# Patient Record
Sex: Female | Born: 1940 | Race: White | Hispanic: No | Smoking: Former smoker
Health system: Southern US, Community
[De-identification: ages and names within clinical notes are randomized; demographics above are authoritative.]

## PROBLEM LIST (undated history)

## (undated) DIAGNOSIS — M1990 Unspecified osteoarthritis, unspecified site: Secondary | ICD-10-CM

## (undated) DIAGNOSIS — Z972 Presence of dental prosthetic device (complete) (partial): Secondary | ICD-10-CM

## (undated) DIAGNOSIS — E079 Disorder of thyroid, unspecified: Secondary | ICD-10-CM

## (undated) DIAGNOSIS — E039 Hypothyroidism, unspecified: Secondary | ICD-10-CM

## (undated) HISTORY — PX: THYROID SURGERY: SHX805

---

## 2003-05-20 ENCOUNTER — Other Ambulatory Visit: Payer: Self-pay

## 2006-07-16 ENCOUNTER — Ambulatory Visit: Payer: Self-pay | Admitting: Gastroenterology

## 2008-12-07 ENCOUNTER — Ambulatory Visit: Payer: Self-pay | Admitting: Podiatry

## 2009-03-23 ENCOUNTER — Ambulatory Visit (HOSPITAL_BASED_OUTPATIENT_CLINIC_OR_DEPARTMENT_OTHER): Admission: RE | Admit: 2009-03-23 | Discharge: 2009-03-23 | Payer: Self-pay | Admitting: Orthopedic Surgery

## 2010-09-14 LAB — POCT HEMOGLOBIN-HEMACUE: Hemoglobin: 12.3 g/dL (ref 12.0–15.0)

## 2012-03-13 ENCOUNTER — Ambulatory Visit: Payer: Self-pay | Admitting: Gastroenterology

## 2012-06-27 ENCOUNTER — Ambulatory Visit: Payer: Self-pay | Admitting: Emergency Medicine

## 2013-10-14 ENCOUNTER — Ambulatory Visit: Payer: Self-pay | Admitting: Physician Assistant

## 2015-02-24 ENCOUNTER — Other Ambulatory Visit: Payer: Self-pay | Admitting: Orthopedic Surgery

## 2015-02-24 DIAGNOSIS — M5442 Lumbago with sciatica, left side: Secondary | ICD-10-CM

## 2015-02-24 DIAGNOSIS — M5136 Other intervertebral disc degeneration, lumbar region: Secondary | ICD-10-CM

## 2015-03-04 ENCOUNTER — Ambulatory Visit
Admission: RE | Admit: 2015-03-04 | Discharge: 2015-03-04 | Disposition: A | Discharge status: Home / Self Care | Payer: Medicare Other | Source: Ambulatory Visit | Attending: Orthopedic Surgery | Admitting: Orthopedic Surgery

## 2015-03-04 DIAGNOSIS — M4806 Spinal stenosis, lumbar region: Secondary | ICD-10-CM | POA: Insufficient documentation

## 2015-03-04 DIAGNOSIS — M5442 Lumbago with sciatica, left side: Secondary | ICD-10-CM | POA: Diagnosis present

## 2015-03-04 DIAGNOSIS — M5136 Other intervertebral disc degeneration, lumbar region: Secondary | ICD-10-CM

## 2015-03-04 DIAGNOSIS — M4856XA Collapsed vertebra, not elsewhere classified, lumbar region, initial encounter for fracture: Secondary | ICD-10-CM | POA: Diagnosis not present

## 2015-06-02 ENCOUNTER — Ambulatory Visit
Admission: EM | Admit: 2015-06-02 | Discharge: 2015-06-02 | Disposition: A | Discharge status: Home / Self Care | Payer: Medicare Other | Attending: Family Medicine | Admitting: Family Medicine

## 2015-06-02 ENCOUNTER — Encounter: Payer: Self-pay | Admitting: Emergency Medicine

## 2015-06-02 DIAGNOSIS — M5441 Lumbago with sciatica, right side: Secondary | ICD-10-CM | POA: Diagnosis not present

## 2015-06-02 HISTORY — DX: Disorder of thyroid, unspecified: E07.9

## 2015-06-02 LAB — URINALYSIS COMPLETE WITH MICROSCOPIC (ARMC ONLY)
BACTERIA UA: NONE SEEN
Bilirubin Urine: NEGATIVE
GLUCOSE, UA: NEGATIVE mg/dL
HGB URINE DIPSTICK: NEGATIVE
LEUKOCYTES UA: NEGATIVE
NITRITE: NEGATIVE
PH: 6 (ref 5.0–8.0)
PROTEIN: NEGATIVE mg/dL
RBC / HPF: NONE SEEN RBC/hpf (ref 0–5)
SPECIFIC GRAVITY, URINE: 1.015 (ref 1.005–1.030)

## 2015-06-02 MED ORDER — MELOXICAM 15 MG PO TABS: 15.0000 mg | ORAL_TABLET | Freq: Every day | ORAL | Status: DC | PRN

## 2015-06-02 MED ORDER — KETOROLAC TROMETHAMINE 30 MG/ML IJ SOLN
30.0000 mg | Freq: Once | INTRAMUSCULAR | Status: AC
  Administered 2015-06-02: 30 mg via INTRAMUSCULAR

## 2015-06-02 NOTE — Discharge Instructions (Signed)
To medication as prescribed. Continue home medications. Apply ice. Rest. Avoid strenuous activity. Stretch well.  As discussed follow-up closely with her primary care physician and orthopedic. Return to urgent care proceed to the ER for increased pain, difficulty urinating, numbness or tingling sensation, difficulty or changes with urination or bowel including inability to urinate or move bowels or incontinence, new or worsening concerns.  Sciatica Sciatica is pain, weakness, numbness, or tingling along the path of the sciatic nerve. The nerve starts in the lower back and runs down the back of each leg. The nerve controls the muscles in the lower leg and in the back of the knee, while also providing sensation to the back of the thigh, lower leg, and the sole of your foot. Sciatica is a symptom of another medical condition. For instance, nerve damage or certain conditions, such as a herniated disk or bone spur on the spine, pinch or put pressure on the sciatic nerve. This causes the pain, weakness, or other sensations normally associated with sciatica. Generally, sciatica only affects one side of the body. CAUSES   Herniated or slipped disc.  Degenerative disk disease.  A pain disorder involving the narrow muscle in the buttocks (piriformis syndrome).  Pelvic injury or fracture.  Pregnancy.  Tumor (rare). SYMPTOMS  Symptoms can vary from mild to very severe. The symptoms usually travel from the low back to the buttocks and down the back of the leg. Symptoms can include:  Mild tingling or dull aches in the lower back, leg, or hip.  Numbness in the back of the calf or sole of the foot.  Burning sensations in the lower back, leg, or hip.  Sharp pains in the lower back, leg, or hip.  Leg weakness.  Severe back pain inhibiting movement. These symptoms may get worse with coughing, sneezing, laughing, or prolonged sitting or standing. Also, being overweight may worsen symptoms. DIAGNOSIS    Your caregiver will perform a physical exam to look for common symptoms of sciatica. He or she may ask you to do certain movements or activities that would trigger sciatic nerve pain. Other tests may be performed to find the cause of the sciatica. These may include:  Blood tests.  X-rays.  Imaging tests, such as an MRI or CT scan. TREATMENT  Treatment is directed at the cause of the sciatic pain. Sometimes, treatment is not necessary and the pain and discomfort goes away on its own. If treatment is needed, your caregiver may suggest:  Over-the-counter medicines to relieve pain.  Prescription medicines, such as anti-inflammatory medicine, muscle relaxants, or narcotics.  Applying heat or ice to the painful area.  Steroid injections to lessen pain, irritation, and inflammation around the nerve.  Reducing activity during periods of pain.  Exercising and stretching to strengthen your abdomen and improve flexibility of your spine. Your caregiver may suggest losing weight if the extra weight makes the back pain worse.  Physical therapy.  Surgery to eliminate what is pressing or pinching the nerve, such as a bone spur or part of a herniated disk. HOME CARE INSTRUCTIONS   Only take over-the-counter or prescription medicines for pain or discomfort as directed by your caregiver.  Apply ice to the affected area for 20 minutes, 3-4 times a day for the first 48-72 hours. Then try heat in the same way.  Exercise, stretch, or perform your usual activities if these do not aggravate your pain.  Attend physical therapy sessions as directed by your caregiver.  Keep all follow-up appointments  as directed by your caregiver.  Do not wear high heels or shoes that do not provide proper support.  Check your mattress to see if it is too soft. A firm mattress may lessen your pain and discomfort. SEEK IMMEDIATE MEDICAL CARE IF:   You lose control of your bowel or bladder (incontinence).  You have  increasing weakness in the lower back, pelvis, buttocks, or legs.  You have redness or swelling of your back.  You have a burning sensation when you urinate.  You have pain that gets worse when you lie down or awakens you at night.  Your pain is worse than you have experienced in the past.  Your pain is lasting longer than 4 weeks.  You are suddenly losing weight without reason. MAKE SURE YOU:  Understand these instructions.  Will watch your condition.  Will get help right away if you are not doing well or get worse.   This information is not intended to replace advice given to you by your health care provider. Make sure you discuss any questions you have with your health care provider.   Document Released: 05/22/2001 Document Revised: 02/16/2015 Document Reviewed: 10/07/2011 Elsevier Interactive Patient Education [employer]

## 2015-06-02 NOTE — ED Notes (Signed)
Pt reports right lower back pain that radiates down right leg and has been off and on since spring time when fell after attacked by dog. Pt reports seen initially but then pain resolved and returned about 1 month ago and worsened this morning

## 2015-06-02 NOTE — ED Provider Notes (Signed)
[facility]  ____________________________________________  Time seen: Approximately 9:39 AM  I have reviewed the triage vital signs and the nursing notes.   HISTORY  Chief Complaint Back Pain   HPI Maria Hood is a 74 y.o. female presents for complaints of right-sided low back pain. Patient states that she has had this pain for at least 6 months. Patient states that she has been followed with orthopedic as well as her primary care physician for the same. States that this pain is same as her normal pain. Patient states that the only difference is that over the last 2-3 weeks her pain has been more frequent with radiation down right leg. Reports that the pain radiation down right leg is consistent with her chronic back pain flareups. Patient states that she has had the exact same in the past multiple times. Denies midline pain.   Patient reports that she was following with orthopedic Dedra Skeensodd Mundy for same. Patient reports MRI in September of this year due to the same pain.  Reports current pain is 5/10 aching with intermittent radiation down right lower leg, States radiation is intermittent, and does not necessarily depend on movements. Denies numbness or tingling sensations.   Patient denies fall, trauma or injury. Reports continues to eat and drink well. Denies urinary or bowel continence or attention. Denies difficulty walking or ambulating. Denies other pain radiation. Denies abdominal pain, chest pain, dysuria, shortness of breath, weakness, numbness, tingling sensations, dizziness, neck or upper back pain.  Patient reports that she has been taking her home tramadol as prescribed but states it only helps somewhat.  PCP:  Dr. Elmer RampVirk. Orthopedic: Lenard ForthMundy   Past Medical History  Diagnosis Date  . Thyroid disease     There are no active problems to display for this patient.   Past Surgical History  Procedure Laterality Date  . Thyroid surgery      Current Outpatient  Rx  Name  Route  Sig  Dispense  Refill  . Aspirin 81 MG tablet   Oral   Take 81 mg by mouth daily.         . calcium carbonate (OS-CAL) 1250 (500 CA) MG chewable tablet   Oral   Chew 1 tablet by mouth daily.         Marland Kitchen. leflunomide (ARAVA) 20 MG tablet   Oral   Take 20 mg by mouth daily.         Marland Kitchen. levothyroxine (SYNTHROID, LEVOTHROID) 100 MCG tablet   Oral   Take 100 mcg by mouth daily before breakfast.         . Multiple Vitamin (MULTIVITAMIN) tablet   Oral   Take 1 tablet by mouth daily.         . simvastatin (ZOCOR) 20 MG tablet   Oral   Take 20 mg by mouth daily.         . traMADol (ULTRAM) 50 MG tablet   Oral   Take by mouth every 6 (six) hours as needed.           Allergies Sulfa antibiotics  No family history on file.  Social History Social History  Substance Use Topics  . Smoking status: Former Games developermoker  . Smokeless tobacco: None  . Alcohol Use: No    Review of Systems Constitutional: No fever/chills Eyes: No visual changes. ENT: No sore throat. Cardiovascular: Denies chest pain. Respiratory: Denies shortness of breath. Gastrointestinal: No abdominal pain.  No nausea, no vomiting.  No diarrhea.  No  constipation. Genitourinary: Negative for dysuria. Musculoskeletal: positive for back pain. Skin: Negative for rash. Neurological: Negative for headaches, focal weakness or numbness.  10-point ROS otherwise negative.  ____________________________________________   PHYSICAL EXAM:  VITAL SIGNS: ED Triage Vitals  Enc Vitals Group     BP 06/02/15 0849 158/74 mmHg     Pulse Rate 06/02/15 0849 81     Resp 06/02/15 0849 16     Temp 06/02/15 0849 97.9 F (36.6 C)     Temp Source 06/02/15 0849 Tympanic     SpO2 06/02/15 0849 100 %     Weight 06/02/15 0849 115 lb (52.164 kg)     Height 06/02/15 0849  (1.626 m)     Head Cir --      Peak Flow --      Pain Score 06/02/15 0855 9     Pain Loc --      Pain Edu? --      Excl. in GC? --      Constitutional: Alert and oriented. Well appearing and in no acute distress. Eyes: Conjunctivae are normal. PERRL. EOMI. Head: Atraumatic.  Nose: No congestion/rhinnorhea.  Mouth/Throat: Mucous membranes are moist.  Oropharynx non-erythematous. Neck: No stridor.  No cervical spine tenderness to palpation. Hematological/Lymphatic/Immunilogical: No cervical lymphadenopathy. Cardiovascular: Normal rate, regular rhythm. Grossly normal heart sounds.  Good peripheral circulation. Respiratory: Normal respiratory effort.  No retractions. Lungs CTAB. Gastrointestinal: Soft and nontender.  Normal Bowel sounds. No CVA tenderness. Musculoskeletal: No lower or upper extremity tenderness nor edema.  No joint effusions. Bilateral pedal pulses equal and easily palpated. No midline cervical, thoracic or lumbar tenderness to palpation. Mild tenderness to palpation right lower lumbar area at greater sciatic notch, bilateral lower extremities nontender to palpation with full range of motion, bilateral straight leg test negative, no saddle anesthesia, no point bony tenderness, steady gait, bilateral plantar flexion and dorsiflexion with good strength. 5 out of 5 strength to bilateral upper and lower extremities. Neurologic:  Normal speech and language. No gross focal neurologic deficits are appreciated. No gait instability. Skin:  Skin is warm, dry and intact. No rash noted. Psychiatric: Mood and affect are normal. Speech and behavior are normal.  ____________________________________________   LABS (all labs ordered are listed, but only abnormal results are displayed)  Labs Reviewed  URINALYSIS COMPLETEWITH MICROSCOPIC ([facility] ONLY) - Abnormal; Notable for the following:    Ketones, ur TRACE (*)    Squamous Epithelial / LPF 6-30 (*)    All other components within normal limits    INITIAL IMPRESSION / ASSESSMENT AND PLAN / ED COURSE  Pertinent labs & imaging results that were available during my care of  the patient were reviewed by me and considered in my medical decision making (see chart for details).  Very well-appearing patient. No acute distress. Presents with complaints of right-sided low back pain. Patient reports that this is a chronic issue for her with acute flare up. Patient reports that this is consistent with acute flareups of the chronic pain. Patient does also report that she does have intermittent relation down right leg which is also consistent with past for the same pain. Denies fall, trauma or direct injury. Patient reports that she is currently not taking any anti-inflammatories and denies contraindication to same. Denies renal insufficiency or cardiac history. Patient reports that she has been taking her home tramadol without much improvement.  Epic utilized and reviewed labs from 03/15/2015 revealing intact kidney functions. Also epic utilizing and reviewed MRI lumbar from  September 2016, which per radiologist showed acute L2 compression fracture, moderate L3 compression fracture as well as spinal stenosis with L3 superior endplate retropulsion.   Again very well-appearing patient. Ambulatory in room. Changes positions from lying to standing quickly without discomfort or distress. No focal neurological deficit. Suspect acute on chronic low back pain and right sciatica. Will treat with Toradol 30 mg IM 1 in urgent care as well as start patient on daily Mobic. Encouraged patient to follow-up closely with her primary care physician as well as orthopedic for this chronic pain.  Discussed follow up with Primary care physician this week. Discussed follow up and return parameters to urgent care as well as ER for numbness, tingling sensation, urinary or bowel retention or incontinence, increased pain, difficulty urinating, is ion or any worsening concerns. Patient verbalized understanding and agreed to plan.   ____________________________________________   FINAL CLINICAL IMPRESSION(S) /  ED DIAGNOSES  Final diagnoses:  Right-sided low back pain with right-sided sciatica     Renford Dills, NP 06/02/15 1117

## 2016-05-07 IMAGING — MR MR LUMBAR SPINE W/O CM
4 of 6 series · 28 of 48 positions shown · non-contrast
Comparison: None.

CLINICAL DATA: Low back pain with right lower extremity
radiculopathy following a fall 3 weeks ago. Initial encounter.

EXAM:
MRI LUMBAR SPINE WITHOUT CONTRAST
TECHNIQUE: Multiplanar, multisequence MR imaging of the lumbar spine was
performed. No intravenous contrast was administered.

[Series 3: T2 · sagittal · 4.0mm · 0.81mm/px · 6 of 17 slices shown (1 of 3)]
[im 1/17]
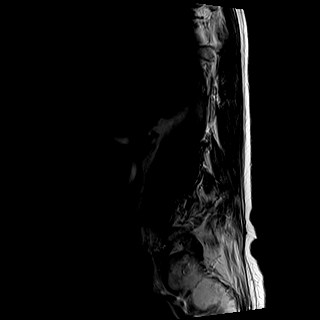
[im 4/17]
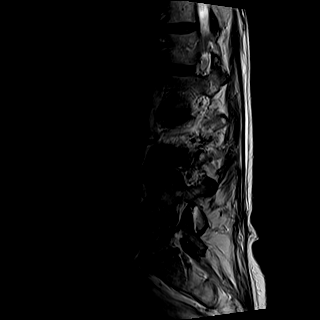
[im 7/17]
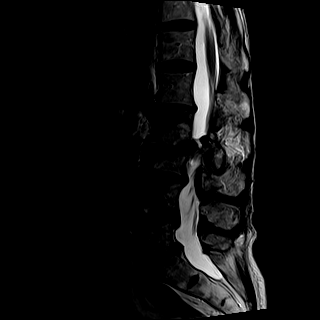
[im 10/17]
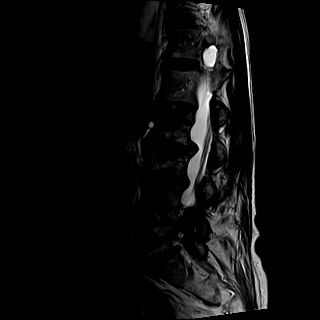
[im 13/17]
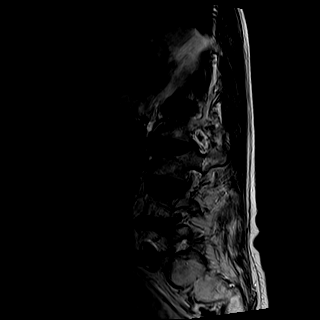
[im 17/17]
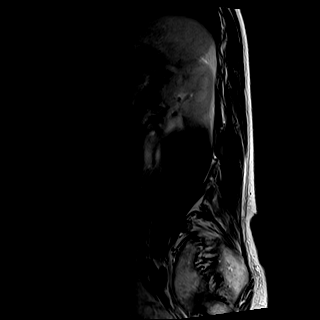

[Series 4: T1 · sagittal · 4.0mm · 0.41mm/px · 6 of 17 slices shown]
[im 1/17]
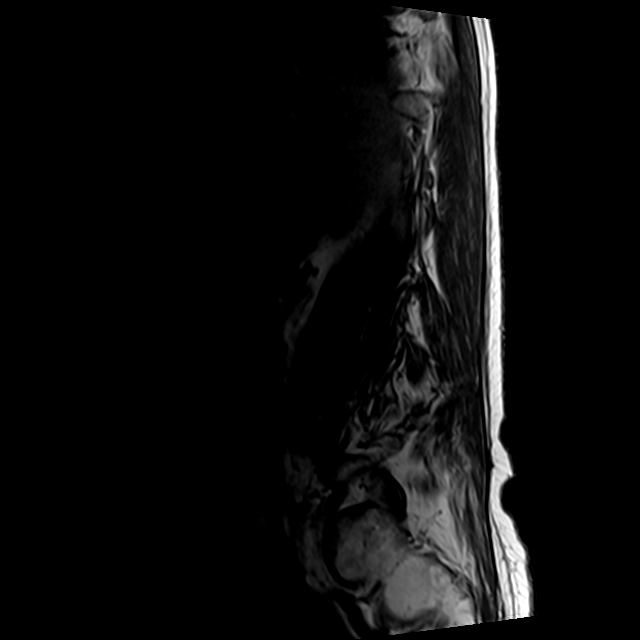
[im 4/17]
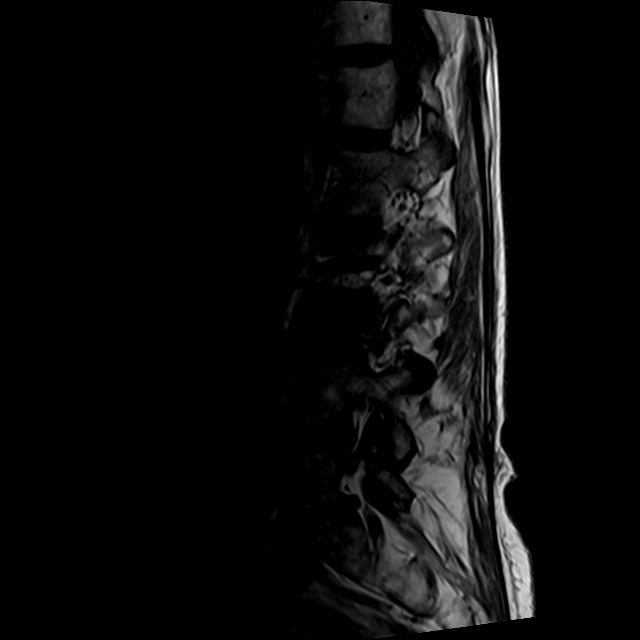
[im 7/17]
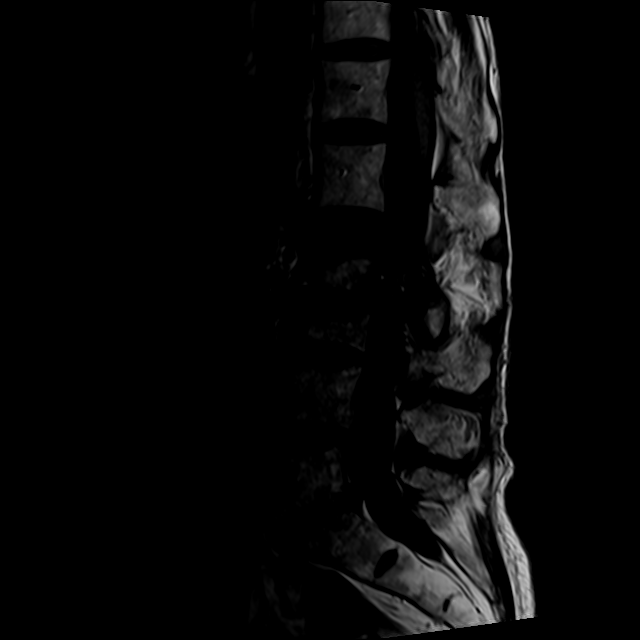
[im 10/17]
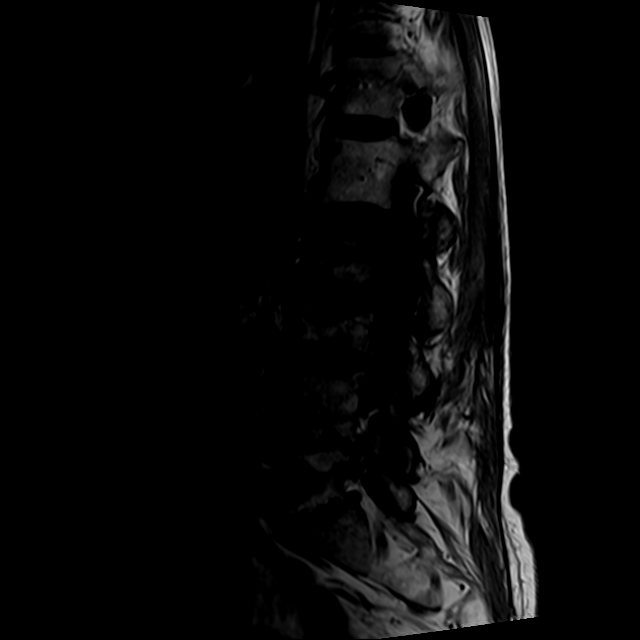
[im 13/17]
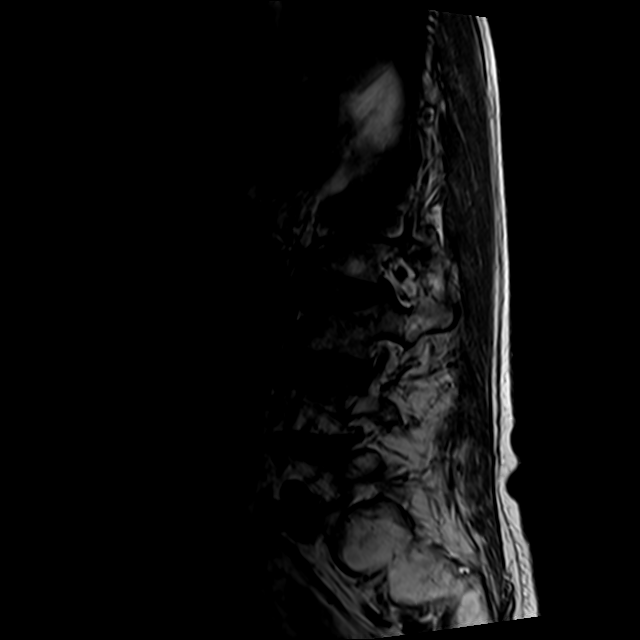
[im 17/17]
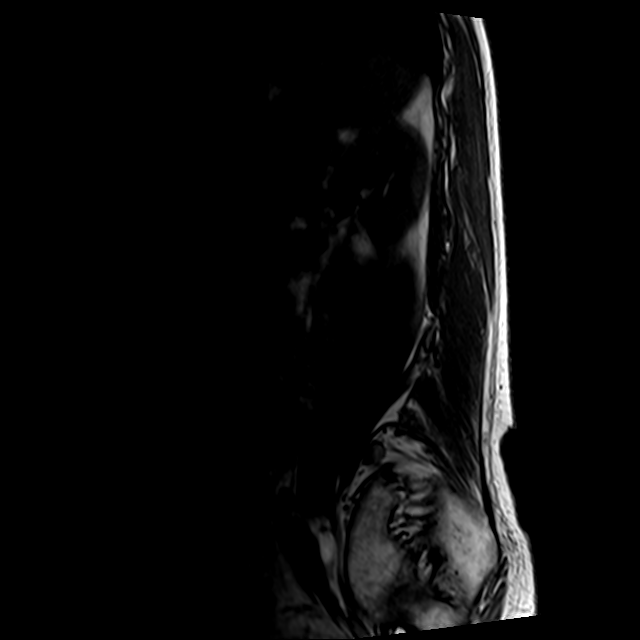

[Series 6: T2 · axial · 4.0mm · 0.78mm/px · z∈[-81,+108]mm · 9 of 39 slices shown (2 of 3)]
[im 1/39]
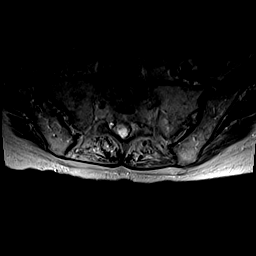
[im 7/39]
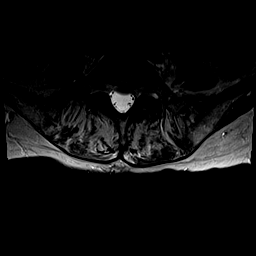
[im 11/39]
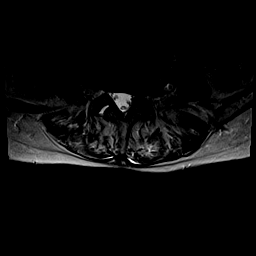
[im 18/39]
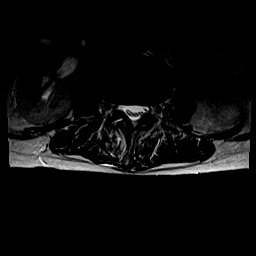
[im 21/39]
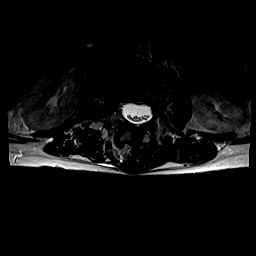
[im 28/39]
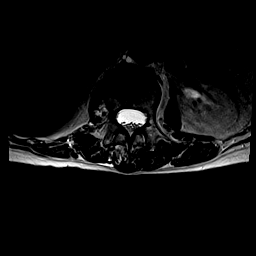
[im 32/39]
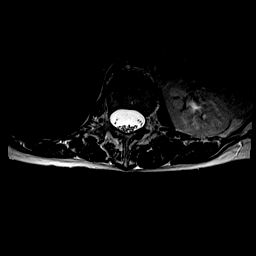
[im 35/39]
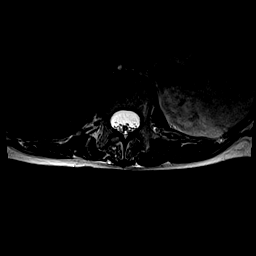
[im 39/39]
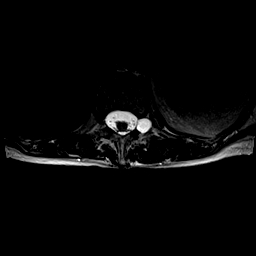

[Series 8: T2 · coronal · 3.0mm · 1.00mm/px · 7 of 23 slices shown (3 of 3)]
[im 1/23]
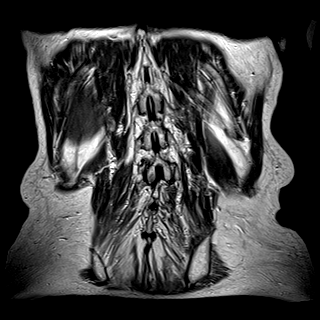
[im 4/23]
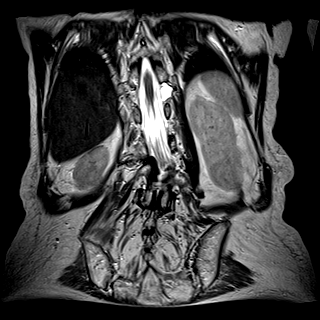
[im 8/23]
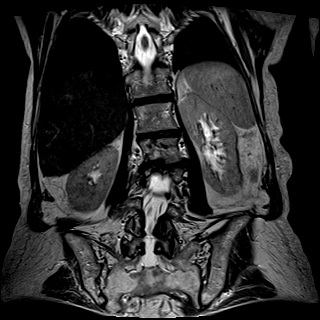
[im 12/23]
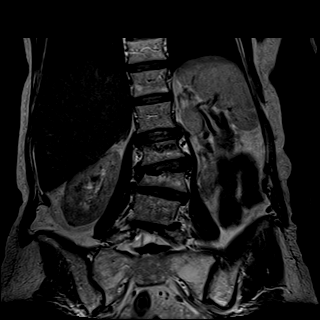
[im 15/23]
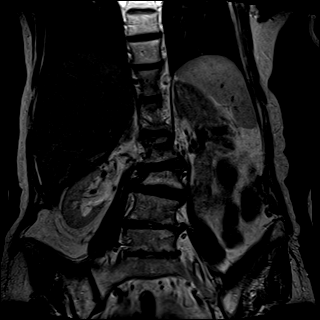
[im 19/23]
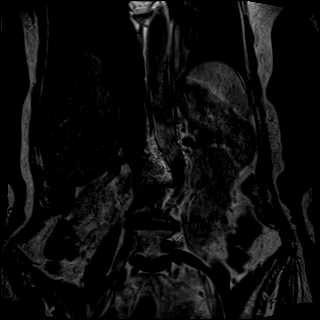
[im 23/23]
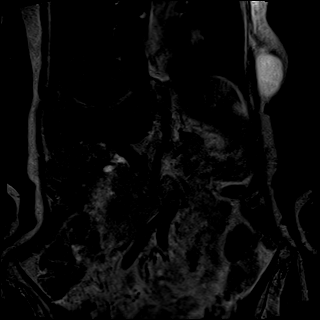

[28 of 48 positions shown; findings below may reference images not displayed]

FINDINGS: For the purposes of this dictation, the lowest well-formed
intervertebral disc space is presumed to be L5-S1.

There is mild lumbar levoscoliosis with apex at L2-3. There are L2
and L3 superior endplate compression fractures demonstrating 20% and
60% height loss, respectively. A fracture line is clearly visible
through the L2 vertebral body with prominent associated marrow edema
extending into the left pedicle and no significant retropulsion.
There is a thin residual cleft subjacent to the L3 superior endplate
with minimal residual edema. L3 superior endplate retropulsion
measure 7 mm.

Grade 1 retrolisthesis of L4 on L5 measures 6 mm, likely facet
mediated. There is diffuse lumbar disc desiccation. Disc space
narrowing is mild at L2-3 and L3-4 and moderate at L4-5 and L5-S1
with associated mild type 2 degenerative endplate changes. Conus
medullaris is normal in signal and terminates at the superior aspect
of L1. A 2 cm cyst is noted in the left T12-L1 neural foramen.

L1-2:  Minimal disc bulging and endplate spurring without stenosis.

L2-3: Broad-based posterior disc osteophyte complex, L3
retropulsion, and mild facet and ligamentum flavum hypertrophy
result in mild spinal stenosis, mild right lateral recess stenosis,
and mild-to-moderate right neural foraminal stenosis. The right L3
nerve root may be affected in the lateral recess.

L3-4: Mild disc bulging asymmetric to the right and mild facet and
ligamentum flavum hypertrophy result in mild spinal stenosis, mild
left lateral recess stenosis, and no significant neural foraminal
stenosis.

L4-5: Listhesis with disc uncovering, left subarticular pseudo disc
protrusion, and mild facet hypertrophy result in severe left and
mild right lateral recess stenosis, moderate left neural foraminal
stenosis, and no significant spinal stenosis. The left L5 nerve root
may be affected in the lateral recess.

L5-S1: Mild circumferential disc bulging, broad central disc
protrusion, and mild left greater than right facet hypertrophy
result in mild bilateral neural foraminal stenosis without spinal
stenosis.
IMPRESSION: 1. Acute, mild L2 compression fracture.
2. Moderate L3 compression fracture which appears older than the L2
fracture though likely incompletely healed.
3. L3 superior endplate retropulsion contributes to mild spinal
stenosis, mild right lateral recess stenosis, and mild-to-moderate
right neural foraminal stenosis. The right L3 nerve root could be
affected in the lateral recess.
4. Mild spinal stenosis and severe left lateral recess stenosis at
L4-5.

## 2018-11-24 ENCOUNTER — Ambulatory Visit: Admit: 2018-11-24 | Payer: Medicare Other | Admitting: Ophthalmology

## 2018-11-24 SURGERY — PHACOEMULSIFICATION, CATARACT, WITH IOL INSERTION
Anesthesia: Topical | Laterality: Left

## 2019-01-26 ENCOUNTER — Encounter: Payer: Self-pay | Admitting: *Deleted

## 2019-01-26 ENCOUNTER — Other Ambulatory Visit: Payer: Self-pay

## 2019-01-28 NOTE — Discharge Instructions (Signed)

## 2019-01-29 ENCOUNTER — Other Ambulatory Visit: Payer: Self-pay

## 2019-01-29 ENCOUNTER — Other Ambulatory Visit
Admission: RE | Admit: 2019-01-29 | Discharge: 2019-01-29 | Disposition: A | Discharge status: Home / Self Care | Payer: Medicare Other | Source: Ambulatory Visit | Attending: Ophthalmology | Admitting: Ophthalmology

## 2019-01-29 DIAGNOSIS — Z20828 Contact with and (suspected) exposure to other viral communicable diseases: Secondary | ICD-10-CM | POA: Diagnosis not present

## 2019-01-29 DIAGNOSIS — Z01812 Encounter for preprocedural laboratory examination: Secondary | ICD-10-CM | POA: Insufficient documentation

## 2019-01-29 LAB — SARS CORONAVIRUS 2 (TAT 6-24 HRS): SARS Coronavirus 2: NEGATIVE

## 2019-02-02 ENCOUNTER — Ambulatory Visit: Payer: Medicare Other | Admitting: Anesthesiology

## 2019-02-02 ENCOUNTER — Other Ambulatory Visit: Payer: Self-pay

## 2019-02-02 ENCOUNTER — Ambulatory Visit
Admission: RE | Admit: 2019-02-02 | Discharge: 2019-02-02 | Disposition: A | Discharge status: Home / Self Care | Payer: Medicare Other | Attending: Ophthalmology | Admitting: Ophthalmology

## 2019-02-02 ENCOUNTER — Encounter
Admission: RE | Disposition: A | Discharge status: Home / Self Care | Payer: Self-pay | Source: Home / Self Care | Attending: Ophthalmology

## 2019-02-02 DIAGNOSIS — M1990 Unspecified osteoarthritis, unspecified site: Secondary | ICD-10-CM | POA: Diagnosis not present

## 2019-02-02 DIAGNOSIS — Z79890 Hormone replacement therapy (postmenopausal): Secondary | ICD-10-CM | POA: Diagnosis not present

## 2019-02-02 DIAGNOSIS — Z888 Allergy status to other drugs, medicaments and biological substances status: Secondary | ICD-10-CM | POA: Diagnosis not present

## 2019-02-02 DIAGNOSIS — I10 Essential (primary) hypertension: Secondary | ICD-10-CM | POA: Insufficient documentation

## 2019-02-02 DIAGNOSIS — E039 Hypothyroidism, unspecified: Secondary | ICD-10-CM | POA: Insufficient documentation

## 2019-02-02 DIAGNOSIS — Z79891 Long term (current) use of opiate analgesic: Secondary | ICD-10-CM | POA: Diagnosis not present

## 2019-02-02 DIAGNOSIS — Z87891 Personal history of nicotine dependence: Secondary | ICD-10-CM | POA: Diagnosis not present

## 2019-02-02 DIAGNOSIS — Z79899 Other long term (current) drug therapy: Secondary | ICD-10-CM | POA: Insufficient documentation

## 2019-02-02 DIAGNOSIS — H2512 Age-related nuclear cataract, left eye: Secondary | ICD-10-CM | POA: Diagnosis not present

## 2019-02-02 DIAGNOSIS — M810 Age-related osteoporosis without current pathological fracture: Secondary | ICD-10-CM | POA: Diagnosis not present

## 2019-02-02 HISTORY — DX: Presence of dental prosthetic device (complete) (partial): Z97.2

## 2019-02-02 HISTORY — PX: CATARACT EXTRACTION W/PHACO: SHX586

## 2019-02-02 HISTORY — DX: Unspecified osteoarthritis, unspecified site: M19.90

## 2019-02-02 SURGERY — PHACOEMULSIFICATION, CATARACT, WITH IOL INSERTION
Anesthesia: Monitor Anesthesia Care | Site: Eye | Laterality: Left

## 2019-02-02 MED ORDER — ACETAMINOPHEN 325 MG PO TABS: 325.0000 mg | ORAL_TABLET | Freq: Once | ORAL | Status: DC

## 2019-02-02 MED ORDER — MOXIFLOXACIN HCL 0.5 % OP SOLN
OPHTHALMIC | Status: DC | PRN
  Administered 2019-02-02: 0.2 mL via OPHTHALMIC

## 2019-02-02 MED ORDER — MIDAZOLAM HCL 2 MG/2ML IJ SOLN
INTRAMUSCULAR | Status: DC | PRN
  Administered 2019-02-02: 1 mg via INTRAVENOUS

## 2019-02-02 MED ORDER — SODIUM HYALURONATE 10 MG/ML IO SOLN
INTRAOCULAR | Status: DC | PRN
  Administered 2019-02-02: 0.55 mL via INTRAOCULAR

## 2019-02-02 MED ORDER — EPINEPHRINE PF 1 MG/ML IJ SOLN
INTRAOCULAR | Status: DC | PRN
  Administered 2019-02-02: 88 mL via OPHTHALMIC

## 2019-02-02 MED ORDER — ACETAMINOPHEN 160 MG/5ML PO SOLN: 325.0000 mg | Freq: Once | ORAL | Status: DC

## 2019-02-02 MED ORDER — SODIUM HYALURONATE 23 MG/ML IO SOLN
INTRAOCULAR | Status: DC | PRN
  Administered 2019-02-02: 0.6 mL via INTRAOCULAR

## 2019-02-02 MED ORDER — TETRACAINE HCL 0.5 % OP SOLN
1.0000 [drp] | OPHTHALMIC | Status: DC | PRN
  Administered 2019-02-02 (×3): 1 [drp] via OPHTHALMIC

## 2019-02-02 MED ORDER — ARMC OPHTHALMIC DILATING DROPS
1.0000 "application " | OPHTHALMIC | Status: DC | PRN
  Administered 2019-02-02 (×3): 1 via OPHTHALMIC

## 2019-02-02 MED ORDER — FENTANYL CITRATE (PF) 100 MCG/2ML IJ SOLN
INTRAMUSCULAR | Status: DC | PRN
  Administered 2019-02-02: 50 ug via INTRAVENOUS

## 2019-02-02 MED ORDER — LIDOCAINE HCL (PF) 2 % IJ SOLN
INTRAOCULAR | Status: DC | PRN
  Administered 2019-02-02: 1 mL via INTRAOCULAR

## 2019-02-02 SURGICAL SUPPLY — 19 items
CANNULA ANT/CHMB 27G (MISCELLANEOUS) ×2 IMPLANT
CANNULA ANT/CHMB 27GA (MISCELLANEOUS) ×6 IMPLANT
DISSECTOR HYDRO NUCLEUS 50X22 (MISCELLANEOUS) ×3 IMPLANT
GLOVE SURG LX 7.5 STRW (GLOVE) ×2
GLOVE SURG LX STRL 7.5 STRW (GLOVE) ×1 IMPLANT
GLOVE SURG SYN 8.5  E (GLOVE) ×2
GLOVE SURG SYN 8.5 E (GLOVE) ×1 IMPLANT
GLOVE SURG SYN 8.5 PF PI (GLOVE) ×1 IMPLANT
GOWN STRL REUS W/ TWL LRG LVL3 (GOWN DISPOSABLE) ×2 IMPLANT
GOWN STRL REUS W/TWL LRG LVL3 (GOWN DISPOSABLE) ×6
LENS IOL TECNIS ITEC 23.0 (Intraocular Lens) ×2 IMPLANT
MARKER SKIN DUAL TIP RULER LAB (MISCELLANEOUS) ×3 IMPLANT
PACK DR. KING ARMS (PACKS) ×3 IMPLANT
PACK EYE AFTER SURG (MISCELLANEOUS) ×3 IMPLANT
PACK OPTHALMIC (MISCELLANEOUS) ×3 IMPLANT
SYR 3ML LL SCALE MARK (SYRINGE) ×3 IMPLANT
SYR TB 1ML LUER SLIP (SYRINGE) ×3 IMPLANT
WATER STERILE IRR 250ML POUR (IV SOLUTION) ×3 IMPLANT
WIPE NON LINTING 3.25X3.25 (MISCELLANEOUS) ×3 IMPLANT

## 2019-02-02 NOTE — Anesthesia Procedure Notes (Signed)
Procedure Name: MAC Date/Time: 02/02/2019 10:31 AM Performed by: Cameron Ali, CRNA Pre-anesthesia Checklist: Patient identified, Emergency Drugs available, Suction available, Timeout performed and Patient being monitored Patient Re-evaluated:Patient Re-evaluated prior to induction Oxygen Delivery Method: Nasal cannula Placement Confirmation: positive ETCO2

## 2019-02-02 NOTE — H&P (Signed)

## 2019-02-02 NOTE — Transfer of Care (Signed)
Immediate Anesthesia Transfer of Care Note  Patient: Maria Hood  Procedure(s) Performed: CATARACT EXTRACTION PHACO AND INTRAOCULAR LENS PLACEMENT (IOC) LEFT (Left Eye)  Patient Location: PACU  Anesthesia Type: MAC  Level of Consciousness: awake, alert  and patient cooperative  Airway and Oxygen Therapy: Patient Spontanous Breathing and Patient connected to supplemental oxygen  Post-op Assessment: Post-op Vital signs reviewed, Patient's Cardiovascular Status Stable, Respiratory Function Stable, Patent Airway and No signs of Nausea or vomiting  Post-op Vital Signs: Reviewed and stable  Complications: No apparent anesthesia complications

## 2019-02-02 NOTE — Op Note (Signed)
OPERATIVE NOTE  Maria Hood [id number] 02/02/2019   PREOPERATIVE DIAGNOSIS:  Nuclear sclerotic cataract left eye.  H25.12   POSTOPERATIVE DIAGNOSIS:    Nuclear sclerotic cataract left eye.     PROCEDURE:  Phacoemusification with posterior chamber intraocular lens placement of the left eye   LENS:   Implant Name Type Inv. Item Serial No. Manufacturer Lot No. LRB No. Used Action  LENS IOL DIOP 23.0 - [id number] Intraocular Lens LENS IOL DIOP 23.0 [id number] AMO  Left 1 Implanted       PCB00 +23.0   ULTRASOUND TIME: 1 minutes 13 seconds.  CDE 16.79   SURGEON:  Benay Pillow, MD, MPH   ANESTHESIA:  Topical with tetracaine drops augmented with 1% preservative-free intracameral lidocaine.  ESTIMATED BLOOD LOSS: <1 mL   COMPLICATIONS:  None.   DESCRIPTION OF PROCEDURE:  The patient was identified in the holding room and transported to the operating room and placed in the supine position under the operating microscope.  The left eye was identified as the operative eye and it was prepped and draped in the usual sterile ophthalmic fashion.   A 1.0 millimeter clear-corneal paracentesis was made at the 5:00 position. 0.5 ml of preservative-free 1% lidocaine with epinephrine was injected into the anterior chamber.  The anterior chamber was filled with Healon 5 viscoelastic.  A 2.4 millimeter keratome was used to make a near-clear corneal incision at the 2:00 position.  A curvilinear capsulorrhexis was made with a cystotome and capsulorrhexis forceps.  Balanced salt solution was used to hydrodissect and hydrodelineate the nucleus.   Phacoemulsification was then used in stop and chop fashion to remove the lens nucleus and epinucleus.  The remaining cortex was then removed using the irrigation and aspiration handpiece. Healon was then placed into the capsular bag to distend it for lens placement.  A lens was then injected into the capsular bag.  The remaining viscoelastic was aspirated.    Wounds were hydrated with balanced salt solution.  The anterior chamber was inflated to a physiologic pressure with balanced salt solution.  Intracameral vigamox 0.1 mL undiltued was injected into the eye and a drop placed onto the ocular surface.  No wound leaks were noted.  The patient was taken to the recovery room in stable condition without complications of anesthesia or surgery  Benay Pillow 02/02/2019, 10:56 AM

## 2019-02-02 NOTE — Anesthesia Preprocedure Evaluation (Signed)
Anesthesia Evaluation  Patient identified by MRN, date of birth, ID band Patient awake    Reviewed: Allergy & Precautions, H&P , NPO status , Patient's Chart, lab work & pertinent test results  Airway Mallampati: II  TM Distance: >3 FB Neck ROM: full    Dental no notable dental hx.    Pulmonary former smoker,    Pulmonary exam normal breath sounds clear to auscultation       Cardiovascular Normal cardiovascular exam Rhythm:regular Rate:Normal     Neuro/Psych    GI/Hepatic   Endo/Other  Hypothyroidism   Renal/GU      Musculoskeletal   Abdominal   Peds  Hematology   Anesthesia Other Findings   Reproductive/Obstetrics                             Anesthesia Physical Anesthesia Plan  ASA: II  Anesthesia Plan: MAC   Post-op Pain Management:    Induction:   PONV Risk Score and Plan: 2 and Midazolam, TIVA and Treatment may vary due to age or medical condition  Airway Management Planned:   Additional Equipment:   Intra-op Plan:   Post-operative Plan:   Informed Consent: I have reviewed the patients History and Physical, chart, labs and discussed the procedure including the risks, benefits and alternatives for the proposed anesthesia with the patient or authorized representative who has indicated his/her understanding and acceptance.       Plan Discussed with: CRNA  Anesthesia Plan Comments:         Anesthesia Quick Evaluation

## 2019-02-02 NOTE — Anesthesia Postprocedure Evaluation (Signed)
Anesthesia Post Note  Patient: Maria Hood  Procedure(s) Performed: CATARACT EXTRACTION PHACO AND INTRAOCULAR LENS PLACEMENT (IOC) LEFT (Left Eye)  Patient location during evaluation: PACU Anesthesia Type: MAC Level of consciousness: awake and alert and oriented Pain management: satisfactory to patient Vital Signs Assessment: post-procedure vital signs reviewed and stable Respiratory status: spontaneous breathing, nonlabored ventilation and respiratory function stable Cardiovascular status: blood pressure returned to baseline and stable Postop Assessment: Adequate PO intake and No signs of nausea or vomiting Anesthetic complications: no    Raliegh Ip

## 2019-02-03 ENCOUNTER — Encounter: Payer: Self-pay | Admitting: Ophthalmology

## 2019-02-20 NOTE — Anesthesia Preprocedure Evaluation (Addendum)
Anesthesia Evaluation  Patient identified by MRN, date of birth, ID band Patient awake    Reviewed: Allergy & Precautions, NPO status , Patient's Chart, lab work & pertinent test results  History of Anesthesia Complications Negative for: history of anesthetic complications  Airway Mallampati: II  TM Distance: >3 FB Neck ROM: Limited    Dental  (+) Lower Dentures, Upper Dentures   Pulmonary former smoker,    breath sounds clear to auscultation       Cardiovascular (-) angina(-) DOE  Rhythm:Regular Rate:Normal     Neuro/Psych    GI/Hepatic neg GERD  ,  Endo/Other  Hypothyroidism   Renal/GU      Musculoskeletal  (+) Arthritis  (On Plaquenil), Rheumatoid disorders,    Abdominal   Peds  Hematology   Anesthesia Other Findings   Reproductive/Obstetrics                            Anesthesia Physical Anesthesia Plan  ASA: II  Anesthesia Plan: MAC   Post-op Pain Management:    Induction: Intravenous  PONV Risk Score and Plan: 2 and TIVA and Midazolam  Airway Management Planned: Nasal Cannula  Additional Equipment:   Intra-op Plan:   Post-operative Plan:   Informed Consent: I have reviewed the patients History and Physical, chart, labs and discussed the procedure including the risks, benefits and alternatives for the proposed anesthesia with the patient or authorized representative who has indicated his/her understanding and acceptance.       Plan Discussed with: CRNA and Anesthesiologist  Anesthesia Plan Comments:         Anesthesia Quick Evaluation   Active Ambulatory Problems    Diagnosis Date Noted  . No Active Ambulatory Problems   Resolved Ambulatory Problems    Diagnosis Date Noted  . No Resolved Ambulatory Problems   Past Medical History:  Diagnosis Date  . Arthritis   . Thyroid disease   . Wears dentures     CBC    Component Value Date/Time   HGB  12.3 03/23/2009 0833    CMP  No results found for: NA, K, CL, CO2, GLUCOSE, BUN, CREATININE, CALCIUM, PROT, ALBUMIN, AST, ALT, ALKPHOS, BILITOT, GFRNONAA, GFRAA  COAGS No results found for: INR, PTT  I have seen and consented the patient, Maria Hood. I have answered all of her questions regarding anesthesia. she is appropriately NPO.   Maria Shih, MD Anesthesia

## 2019-02-26 ENCOUNTER — Other Ambulatory Visit: Payer: Self-pay

## 2019-02-26 ENCOUNTER — Other Ambulatory Visit
Admission: RE | Admit: 2019-02-26 | Discharge: 2019-02-26 | Disposition: A | Discharge status: Home / Self Care | Payer: Medicare Other | Source: Ambulatory Visit | Attending: Ophthalmology | Admitting: Ophthalmology

## 2019-02-26 DIAGNOSIS — Z20828 Contact with and (suspected) exposure to other viral communicable diseases: Secondary | ICD-10-CM | POA: Insufficient documentation

## 2019-02-26 DIAGNOSIS — H2511 Age-related nuclear cataract, right eye: Secondary | ICD-10-CM | POA: Diagnosis not present

## 2019-02-26 DIAGNOSIS — Z01812 Encounter for preprocedural laboratory examination: Secondary | ICD-10-CM | POA: Diagnosis present

## 2019-02-26 LAB — SARS CORONAVIRUS 2 (TAT 6-24 HRS): SARS Coronavirus 2: NEGATIVE

## 2019-02-26 NOTE — Discharge Instructions (Signed)

## 2019-03-02 ENCOUNTER — Encounter
Admission: RE | Disposition: A | Discharge status: Home / Self Care | Payer: Self-pay | Source: Ambulatory Visit | Attending: Ophthalmology

## 2019-03-02 ENCOUNTER — Ambulatory Visit
Admission: RE | Admit: 2019-03-02 | Discharge: 2019-03-02 | Disposition: A | Discharge status: Home / Self Care | Payer: Medicare Other | Source: Ambulatory Visit | Attending: Ophthalmology | Admitting: Ophthalmology

## 2019-03-02 ENCOUNTER — Ambulatory Visit: Payer: Medicare Other | Admitting: Anesthesiology

## 2019-03-02 ENCOUNTER — Other Ambulatory Visit: Payer: Self-pay

## 2019-03-02 DIAGNOSIS — Z79899 Other long term (current) drug therapy: Secondary | ICD-10-CM | POA: Insufficient documentation

## 2019-03-02 DIAGNOSIS — Z87891 Personal history of nicotine dependence: Secondary | ICD-10-CM | POA: Insufficient documentation

## 2019-03-02 DIAGNOSIS — I10 Essential (primary) hypertension: Secondary | ICD-10-CM | POA: Insufficient documentation

## 2019-03-02 DIAGNOSIS — Z79890 Hormone replacement therapy (postmenopausal): Secondary | ICD-10-CM | POA: Diagnosis not present

## 2019-03-02 DIAGNOSIS — H2511 Age-related nuclear cataract, right eye: Secondary | ICD-10-CM | POA: Insufficient documentation

## 2019-03-02 HISTORY — PX: CATARACT EXTRACTION W/PHACO: SHX586

## 2019-03-02 HISTORY — DX: Hypothyroidism, unspecified: E03.9

## 2019-03-02 SURGERY — PHACOEMULSIFICATION, CATARACT, WITH IOL INSERTION
Anesthesia: Monitor Anesthesia Care | Site: Eye | Laterality: Right

## 2019-03-02 MED ORDER — ONDANSETRON HCL 4 MG/2ML IJ SOLN: 4.0000 mg | Freq: Once | INTRAMUSCULAR | Status: DC | PRN

## 2019-03-02 MED ORDER — SODIUM HYALURONATE 23 MG/ML IO SOLN
INTRAOCULAR | Status: DC | PRN
  Administered 2019-03-02: 0.6 mL via INTRAOCULAR

## 2019-03-02 MED ORDER — LIDOCAINE HCL (PF) 2 % IJ SOLN
INTRAOCULAR | Status: DC | PRN
  Administered 2019-03-02: 2 mL via INTRAOCULAR

## 2019-03-02 MED ORDER — TETRACAINE HCL 0.5 % OP SOLN
1.0000 [drp] | OPHTHALMIC | Status: DC | PRN
  Administered 2019-03-02 (×3): 1 [drp] via OPHTHALMIC

## 2019-03-02 MED ORDER — FENTANYL CITRATE (PF) 100 MCG/2ML IJ SOLN
INTRAMUSCULAR | Status: DC | PRN
  Administered 2019-03-02: 50 ug via INTRAVENOUS

## 2019-03-02 MED ORDER — LACTATED RINGERS IV SOLN: 100.0000 mL/h | INTRAVENOUS | Status: DC

## 2019-03-02 MED ORDER — MOXIFLOXACIN HCL 0.5 % OP SOLN
OPHTHALMIC | Status: DC | PRN
  Administered 2019-03-02: 0.2 mL via OPHTHALMIC

## 2019-03-02 MED ORDER — ACETAMINOPHEN 10 MG/ML IV SOLN: 1000.0000 mg | Freq: Once | INTRAVENOUS | Status: DC | PRN

## 2019-03-02 MED ORDER — ARMC OPHTHALMIC DILATING DROPS
1.0000 "application " | OPHTHALMIC | Status: DC | PRN
  Administered 2019-03-02 (×3): 1 via OPHTHALMIC

## 2019-03-02 MED ORDER — MIDAZOLAM HCL 2 MG/2ML IJ SOLN
INTRAMUSCULAR | Status: DC | PRN
  Administered 2019-03-02 (×2): 1 mg via INTRAVENOUS

## 2019-03-02 MED ORDER — SODIUM HYALURONATE 10 MG/ML IO SOLN
INTRAOCULAR | Status: DC | PRN
  Administered 2019-03-02: 0.55 mL via INTRAOCULAR

## 2019-03-02 MED ORDER — EPINEPHRINE PF 1 MG/ML IJ SOLN
INTRAOCULAR | Status: DC | PRN
  Administered 2019-03-02: 107 mL via OPHTHALMIC

## 2019-03-02 SURGICAL SUPPLY — 19 items
CANNULA ANT/CHMB 27G (MISCELLANEOUS) ×2 IMPLANT
CANNULA ANT/CHMB 27GA (MISCELLANEOUS) ×6 IMPLANT
DISSECTOR HYDRO NUCLEUS 50X22 (MISCELLANEOUS) ×3 IMPLANT
GLOVE SURG LX 7.5 STRW (GLOVE) ×2
GLOVE SURG LX STRL 7.5 STRW (GLOVE) ×1 IMPLANT
GLOVE SURG SYN 8.5  E (GLOVE) ×2
GLOVE SURG SYN 8.5 E (GLOVE) ×1 IMPLANT
GLOVE SURG SYN 8.5 PF PI (GLOVE) ×1 IMPLANT
GOWN STRL REUS W/ TWL LRG LVL3 (GOWN DISPOSABLE) ×2 IMPLANT
GOWN STRL REUS W/TWL LRG LVL3 (GOWN DISPOSABLE) ×6
LENS IOL TECNIS ITEC 23.5 (Intraocular Lens) ×2 IMPLANT
MARKER SKIN DUAL TIP RULER LAB (MISCELLANEOUS) ×3 IMPLANT
PACK DR. KING ARMS (PACKS) ×3 IMPLANT
PACK EYE AFTER SURG (MISCELLANEOUS) ×3 IMPLANT
PACK OPTHALMIC (MISCELLANEOUS) ×3 IMPLANT
SYR 3ML LL SCALE MARK (SYRINGE) ×3 IMPLANT
SYR TB 1ML LUER SLIP (SYRINGE) ×3 IMPLANT
WATER STERILE IRR 250ML POUR (IV SOLUTION) ×3 IMPLANT
WIPE NON LINTING 3.25X3.25 (MISCELLANEOUS) ×3 IMPLANT

## 2019-03-02 NOTE — Anesthesia Procedure Notes (Signed)
Procedure Name: MAC Date/Time: 03/02/2019 12:41 PM Performed by: Cameron Ali, CRNA Pre-anesthesia Checklist: Patient identified, Emergency Drugs available, Suction available, Timeout performed and Patient being monitored Patient Re-evaluated:Patient Re-evaluated prior to induction Oxygen Delivery Method: Nasal cannula Placement Confirmation: positive ETCO2

## 2019-03-02 NOTE — Op Note (Signed)
OPERATIVE NOTE  Maria Hood [id number] 03/02/2019   PREOPERATIVE DIAGNOSIS:  Nuclear sclerotic cataract right eye.  H25.11   POSTOPERATIVE DIAGNOSIS:    Nuclear sclerotic cataract right eye.     PROCEDURE:  Phacoemusification with posterior chamber intraocular lens placement of the right eye   LENS:   Implant Name Type Inv. Item Serial No. Manufacturer Lot No. LRB No. Used Action  LENS IOL DIOP 23.5 - [id number] Intraocular Lens LENS IOL DIOP 23.5 [id number] AMO  Right 1 Implanted       Procedure(s): CATARACT EXTRACTION PHACO AND INTRAOCULAR LENS PLACEMENT (IOC) RIGHT 01:31.3          19.8%          18.83 (Right)    SURGEON:  Benay Pillow, MD, MPH  ANESTHESIOLOGIST: Anesthesiologist: Heniser, Fredric Dine, MD CRNA: Cameron Ali, CRNA   ANESTHESIA:  Topical with tetracaine drops augmented with 1% preservative-free intracameral lidocaine.  ESTIMATED BLOOD LOSS: less than 1 mL.   COMPLICATIONS:  None.   DESCRIPTION OF PROCEDURE:  The patient was identified in the holding room and transported to the operating room and placed in the supine position under the operating microscope.  The right eye was identified as the operative eye and it was prepped and draped in the usual sterile ophthalmic fashion.   A 1.0 millimeter clear-corneal paracentesis was made at the 10:30 position. 0.5 ml of preservative-free 1% lidocaine with epinephrine was injected into the anterior chamber.  The anterior chamber was filled with Healon 5 viscoelastic.  A 2.4 millimeter keratome was used to make a near-clear corneal incision at the 8:00 position.  A curvilinear capsulorrhexis was made with a cystotome and capsulorrhexis forceps.  Balanced salt solution was used to hydrodissect and hydrodelineate the nucleus.   Phacoemulsification was then used in stop and chop fashion to remove the lens nucleus and epinucleus.  The remaining cortex was then removed using the irrigation and aspiration handpiece. Healon was  then placed into the capsular bag to distend it for lens placement.  A lens was then injected into the capsular bag.  The remaining viscoelastic was aspirated.   Wounds were hydrated with balanced salt solution.  The anterior chamber was inflated to a physiologic pressure with balanced salt solution.   Intracameral vigamox 0.1 mL undiluted was injected into the eye and a drop placed onto the ocular surface.  No wound leaks were noted.  The patient was taken to the recovery room in stable condition without complications of anesthesia or surgery  Benay Pillow 03/02/2019, 1:08 PM

## 2019-03-02 NOTE — Anesthesia Postprocedure Evaluation (Signed)
Anesthesia Post Note  Patient: Maria Hood  Procedure(s) Performed: CATARACT EXTRACTION PHACO AND INTRAOCULAR LENS PLACEMENT (IOC) RIGHT 01:31.3          19.8%          18.83 (Right Eye)  Patient location during evaluation: PACU Anesthesia Type: MAC Level of consciousness: awake and alert Pain management: pain level controlled Vital Signs Assessment: post-procedure vital signs reviewed and stable Respiratory status: spontaneous breathing, nonlabored ventilation, respiratory function stable and patient connected to nasal cannula oxygen Cardiovascular status: stable and blood pressure returned to baseline Postop Assessment: no apparent nausea or vomiting Anesthetic complications: no    Braxston Quinter A  Winfield Caba

## 2019-03-02 NOTE — Transfer of Care (Signed)
Immediate Anesthesia Transfer of Care Note  Patient: Maria Hood  Procedure(s) Performed: CATARACT EXTRACTION PHACO AND INTRAOCULAR LENS PLACEMENT (IOC) RIGHT 01:31.3          19.8%          18.83 (Right Eye)  Patient Location: PACU  Anesthesia Type: MAC  Level of Consciousness: awake, alert  and patient cooperative  Airway and Oxygen Therapy: Patient Spontanous Breathing and Patient connected to supplemental oxygen  Post-op Assessment: Post-op Vital signs reviewed, Patient's Cardiovascular Status Stable, Respiratory Function Stable, Patent Airway and No signs of Nausea or vomiting  Post-op Vital Signs: Reviewed and stable  Complications: No apparent anesthesia complications

## 2019-03-03 ENCOUNTER — Encounter: Payer: Self-pay | Admitting: Ophthalmology

## 2019-03-06 NOTE — H&P (Signed)

## 2021-04-29 ENCOUNTER — Ambulatory Visit
Admission: EM | Admit: 2021-04-29 | Discharge: 2021-04-29 | Disposition: A | Discharge status: Home / Self Care | Payer: Medicare Other | Attending: Emergency Medicine | Admitting: Emergency Medicine

## 2021-04-29 ENCOUNTER — Other Ambulatory Visit: Payer: Self-pay

## 2021-04-29 DIAGNOSIS — S161XXA Strain of muscle, fascia and tendon at neck level, initial encounter: Secondary | ICD-10-CM | POA: Diagnosis not present

## 2021-04-29 DIAGNOSIS — M7912 Myalgia of auxiliary muscles, head and neck: Secondary | ICD-10-CM

## 2021-04-29 MED ORDER — PREDNISONE 10 MG (21) PO TBPK: ORAL_TABLET | Freq: Every day | ORAL | 0 refills | Status: DC

## 2021-04-29 MED ORDER — METHOCARBAMOL 500 MG PO TABS: 500.0000 mg | ORAL_TABLET | Freq: Two times a day (BID) | ORAL | 0 refills | Status: DC

## 2021-04-29 NOTE — ED Provider Notes (Signed)
[facility]    CSN: [id number] Arrival date & time: 04/29/21  1004      History   Chief Complaint Chief Complaint  Patient presents with   Neck Pain    HPI Maria Hood is a 80 y.o. female.   Wed was getting a mammogram and felt as she was twisted in the opposite direct and began to have lt sided neck pain that radiates down lt shoulder area. States that it is hard to put a coat on or to turn her neck good. Is doing stretch exercise that has helped and using heat. No chest pain, no sob, no other known injury.       Past Medical History:  Diagnosis Date   Arthritis    hands, lower back   Hypothyroidism    Thyroid disease    Wears dentures    full upper and lower    There are no problems to display for this patient.   Past Surgical History:  Procedure Laterality Date   CATARACT EXTRACTION W/PHACO Left 02/02/2019   Procedure: CATARACT EXTRACTION PHACO AND INTRAOCULAR LENS PLACEMENT (IOC) LEFT;  Surgeon: Nevada Crane, MD;  Location: [facility];  Service: Ophthalmology;  Laterality: Left;   CATARACT EXTRACTION W/PHACO Right 03/02/2019   Procedure: CATARACT EXTRACTION PHACO AND INTRAOCULAR LENS PLACEMENT (IOC) RIGHT 01:31.3          19.8%          18.83;  Surgeon: Nevada Crane, MD;  Location: [facility];  Service: Ophthalmology;  Laterality: Right;   THYROID SURGERY      OB History   No obstetric history on file.      Home Medications    Prior to Admission medications   Medication Sig Start Date End Date Taking? Authorizing Provider  alendronate (FOSAMAX) 70 MG tablet Take 70 mg by mouth once a week. Take with a full glass of water on an empty stomach.   Yes [provider]  aspirin 325 MG tablet Take 325 mg by mouth daily.   Yes [provider]  calcium carbonate (OS-CAL) 1250 (500 CA) MG chewable tablet Chew 1 tablet by mouth daily.   Yes [provider]  diclofenac sodium (VOLTAREN) 1 % GEL  Apply topically 4 (four) times daily as needed.   Yes [provider]  hydroxychloroquine (PLAQUENIL) 200 MG tablet Take by mouth daily.   Yes [provider]  leflunomide (ARAVA) 20 MG tablet Take 20 mg by mouth daily.   Yes [provider]  levothyroxine (SYNTHROID, LEVOTHROID) 100 MCG tablet Take 100 mcg by mouth daily before breakfast.   Yes [provider]  lisinopril (ZESTRIL) 2.5 MG tablet Take 2.5 mg by mouth daily.   Yes [provider]  methocarbamol (ROBAXIN) 500 MG tablet Take 1 tablet (500 mg total) by mouth 2 (two) times daily. 04/29/21  Yes Coralyn Mark, NP  Multiple Vitamin (MULTIVITAMIN) tablet Take 1 tablet by mouth daily.   Yes [provider]  predniSONE (STERAPRED UNI-PAK 21 TAB) 10 MG (21) TBPK tablet Take by mouth daily. Take 6 tabs by mouth daily  for 2 days, then 5 tabs for 2 days, then 4 tabs for 2 days, then 3 tabs for 2 days, 2 tabs for 2 days, then 1 tab by mouth daily for 2 days 04/29/21  Yes Coralyn Mark, NP  traMADol (ULTRAM) 50 MG tablet Take by mouth every 6 (six) hours as needed.   Yes  [provider]    Family History History reviewed. No pertinent family history.  Social History Social History   Tobacco Use   Smoking status: Former    Types: Cigarettes    Quit date: 1961    Years since quitting: 61.9   Smokeless tobacco: Never   Tobacco comments:    smoked as teenager  Advertising account planner   Vaping Use: Never used  Substance Use Topics   Alcohol use: No   Drug use: Never     Allergies   Lodine [etodolac], Methotrexate derivatives, Rezulin [troglitazone], Sulfa antibiotics, and Vioxx [rofecoxib]   Review of Systems Review of Systems  Constitutional:  Negative for activity change and fever.  Eyes: Negative.   Respiratory: Negative.    Cardiovascular: Negative.   Gastrointestinal: Negative.   Musculoskeletal:  Positive for neck pain and neck stiffness.       That radiates  down left arm more so with turning on neck   Skin: Negative.   Neurological: Negative.     Physical Exam Triage Vital Signs ED Triage Vitals  Enc Vitals Group     BP 04/29/21 1038 (!) 148/76     Pulse Rate 04/29/21 1038 92     Resp 04/29/21 1038 18     Temp 04/29/21 1038 98.6 F (37 C)     Temp Source 04/29/21 1038 Oral     SpO2 04/29/21 1038 99 %     Weight 04/29/21 1037 105 lb (47.6 kg)     Height 04/29/21 1037 5\' 2"  (1.575 m)     Head Circumference --      Peak Flow --      Pain Score 04/29/21 1037 9     Pain Loc --      Pain Edu? --      Excl. in GC? --    No data found.  Updated Vital Signs BP (!) 148/76 (BP Location: Left Arm)   Pulse 92   Temp 98.6 F (37 C) (Oral)   Resp 18   Ht 5\' 2"  (1.575 m)   Wt 105 lb (47.6 kg)   SpO2 99%   BMI 19.20 kg/m   Visual Acuity Right Eye Distance:   Left Eye Distance:   Bilateral Distance:    Right Eye Near:   Left Eye Near:    Bilateral Near:     Physical Exam Constitutional:      Appearance: Normal appearance.  HENT:     Right Ear: Tympanic membrane normal.     Left Ear: Tympanic membrane normal.  Cardiovascular:     Rate and Rhythm: Normal rate.  Pulmonary:     Effort: Pulmonary effort is normal.  Abdominal:     General: Abdomen is flat.  Musculoskeletal:        General: Tenderness present.     Cervical back: Tenderness present.     Comments: Lt upper arm and neck area with rotating side to side   Skin:    General: Skin is warm.     Capillary Refill: Capillary refill takes less than 2 seconds.  Neurological:     General: No focal deficit present.     Mental Status: She is alert. Mental status is at baseline.     UC Treatments / Results  Labs (all labs ordered are listed, but only abnormal results are displayed) Labs Reviewed - No data to display  EKG   Radiology No results found.  Procedures Procedures (including critical care time)  Medications Ordered in  UC Medications - No data to  display  Initial Impression / Assessment and Plan / UC Course  I have reviewed the triage vital signs and the nursing notes.  Pertinent labs & imaging results that were available during my care of the patient were reviewed by me and considered in my medical decision making (see chart for details).     Use heat as needed for pain  Discussed stretching exercises  Take muscle relaxer as needed take half a dose  Take motrin for pain  Follow up with ortho if pain persist  Final Clinical Impressions(s) / UC Diagnoses   Final diagnoses:  Strain of neck muscle, initial encounter  Sternocleidomastoid muscle tenderness     Discharge Instructions      Use heat as needed for pain  Discussed stretching exercises  Take muscle relaxer as needed take half a dose  Take motrin for pain  Follow up with ortho if pain persis     ED Prescriptions     Medication Sig Dispense Auth. Provider   predniSONE (STERAPRED UNI-PAK 21 TAB) 10 MG (21) TBPK tablet Take by mouth daily. Take 6 tabs by mouth daily  for 2 days, then 5 tabs for 2 days, then 4 tabs for 2 days, then 3 tabs for 2 days, 2 tabs for 2 days, then 1 tab by mouth daily for 2 days 42 tablet Maple Mirza L, NP   methocarbamol (ROBAXIN) 500 MG tablet Take 1 tablet (500 mg total) by mouth 2 (two) times daily. 20 tablet Coralyn Mark, NP      PDMP not reviewed this encounter.   Coralyn Mark, NP 04/29/21 1147

## 2021-04-29 NOTE — ED Triage Notes (Signed)
Pt here with C/O left side body pain. Arm, neck and side. States she went to get a mammogram and was put into a different position, has had pain since.

## 2021-04-29 NOTE — Discharge Instructions (Addendum)
Use heat as needed for pain  Discussed stretching exercises  Take muscle relaxer as needed take half a dose  Take motrin for pain  Follow up with ortho if pain persis

## 2021-08-08 ENCOUNTER — Ambulatory Visit
Admission: EM | Admit: 2021-08-08 | Discharge: 2021-08-08 | Disposition: A | Discharge status: Home / Self Care | Payer: Medicare Other | Attending: Physician Assistant | Admitting: Physician Assistant

## 2021-08-08 ENCOUNTER — Encounter: Payer: Self-pay | Admitting: Emergency Medicine

## 2021-08-08 ENCOUNTER — Other Ambulatory Visit: Payer: Self-pay

## 2021-08-08 DIAGNOSIS — B369 Superficial mycosis, unspecified: Secondary | ICD-10-CM

## 2021-08-08 MED ORDER — CLOTRIMAZOLE 1 % EX CREA: TOPICAL_CREAM | CUTANEOUS | 0 refills | Status: AC

## 2021-08-08 NOTE — ED Triage Notes (Signed)
Pt c/o pain, redness in her umbilicus. Started a couple of days ago. She has been using neosporin on it. Pt umbilicus folds where the skin runs together.

## 2021-08-08 NOTE — ED Provider Notes (Signed)
[facility]    CSN: [id number] Arrival date & time: 08/08/21  0845      History   Chief Complaint Chief Complaint  Patient presents with   Skin Problem    HPI Maria Hood is a 81 y.o. female presenting for concerns regarding a rash of her umbilicus x3 days.  She says it is itchy, burns and "smells."  Patient says she had a similar issue years ago but does not member the cause.  She has been keeping a lean and apply Neosporin but says it has not gotten any better.  Denies fever or associated pain.  No pustular drainage reported.  Of note, patient has significant arthritis and kyphosis.  No other complaints.  HPI  Past Medical History:  Diagnosis Date   Arthritis    hands, lower back   Hypothyroidism    Thyroid disease    Wears dentures    full upper and lower    There are no problems to display for this patient.   Past Surgical History:  Procedure Laterality Date   CATARACT EXTRACTION W/PHACO Left 02/02/2019   Procedure: CATARACT EXTRACTION PHACO AND INTRAOCULAR LENS PLACEMENT (IOC) LEFT;  Surgeon: Nevada Crane, MD;  Location: [facility];  Service: Ophthalmology;  Laterality: Left;   CATARACT EXTRACTION W/PHACO Right 03/02/2019   Procedure: CATARACT EXTRACTION PHACO AND INTRAOCULAR LENS PLACEMENT (IOC) RIGHT 01:31.3          19.8%          18.83;  Surgeon: Nevada Crane, MD;  Location: [facility];  Service: Ophthalmology;  Laterality: Right;   THYROID SURGERY      OB History   No obstetric history on file.      Home Medications    Prior to Admission medications   Medication Sig Start Date End Date Taking? Authorizing Provider  alendronate (FOSAMAX) 70 MG tablet Take 70 mg by mouth once a week. Take with a full glass of water on an empty stomach.   Yes [provider]  clotrimazole (LOTRIMIN) 1 % cream Apply to affected area 2 times daily 08/08/21 08/18/21 Yes Eusebio Friendly B, PA-C  leflunomide (ARAVA) 20 MG  tablet Take 20 mg by mouth daily.   Yes [provider]  levothyroxine (SYNTHROID, LEVOTHROID) 100 MCG tablet Take 100 mcg by mouth daily before breakfast.   Yes [provider]  lisinopril (ZESTRIL) 2.5 MG tablet Take 2.5 mg by mouth daily.   Yes [provider]  Multiple Vitamin (MULTIVITAMIN) tablet Take 1 tablet by mouth daily.   Yes [provider]  traMADol (ULTRAM) 50 MG tablet Take by mouth every 6 (six) hours as needed.   Yes [provider]  aspirin 325 MG tablet Take 325 mg by mouth daily.    [provider]  calcium carbonate (OS-CAL) 1250 (500 CA) MG chewable tablet Chew 1 tablet by mouth daily.    [provider]  diclofenac sodium (VOLTAREN) 1 % GEL Apply topically 4 (four) times daily as needed.    [provider]  hydroxychloroquine (PLAQUENIL) 200 MG tablet Take by mouth daily.    [provider]  methocarbamol (ROBAXIN) 500 MG tablet Take 1 tablet (500 mg total) by mouth 2 (two) times daily. 04/29/21   Coralyn Mark, NP  predniSONE (STERAPRED UNI-PAK 21 TAB) 10 MG (21) TBPK tablet Take by mouth daily. Take 6 tabs by mouth daily  for 2 days, then 5 tabs for 2 days, then  4 tabs for 2 days, then 3 tabs for 2 days, 2 tabs for 2 days, then 1 tab by mouth daily for 2 days 04/29/21   Coralyn Mark, NP    Family History No family history on file.  Social History Social History   Tobacco Use   Smoking status: Former    Types: Cigarettes    Quit date: 1961    Years since quitting: 62.2   Smokeless tobacco: Never   Tobacco comments:    smoked as teenager  Advertising account planner   Vaping Use: Never used  Substance Use Topics   Alcohol use: No   Drug use: Never     Allergies   Lodine [etodolac], Methotrexate derivatives, Rezulin [troglitazone], Sulfa antibiotics, and Vioxx [rofecoxib]   Review of Systems Review of Systems  Constitutional:  Negative for fatigue and fever.  Skin:  Positive  for color change and rash.  Neurological:  Negative for weakness.    Physical Exam Triage Vital Signs ED Triage Vitals  Enc Vitals Group     BP 08/08/21 0917 (!) 177/85     Pulse Rate 08/08/21 0917 86     Resp 08/08/21 0917 18     Temp 08/08/21 0917 98.1 F (36.7 C)     Temp Source 08/08/21 0917 Oral     SpO2 08/08/21 0917 98 %     Weight 08/08/21 0912 104 lb 15 oz (47.6 kg)     Height 08/08/21 0912 5\' 2"  (1.575 m)     Head Circumference --      Peak Flow --      Pain Score 08/08/21 0912 0     Pain Loc --      Pain Edu? --      Excl. in GC? --    No data found.  Updated Vital Signs BP (!) 177/85 (BP Location: Right Arm)    Pulse 86    Temp 98.1 F (36.7 C) (Oral)    Resp 18    Ht 5\' 2"  (1.575 m)    Wt 104 lb 15 oz (47.6 kg)    SpO2 98%    BMI 19.19 kg/m      Physical Exam Vitals and nursing note reviewed.  Constitutional:      General: She is not in acute distress.    Appearance: Normal appearance. She is not ill-appearing or toxic-appearing.  HENT:     Head: Normocephalic and atraumatic.  Eyes:     General: No scleral icterus.       Right eye: No discharge.        Left eye: No discharge.     Conjunctiva/sclera: Conjunctivae normal.  Cardiovascular:     Rate and Rhythm: Normal rate and regular rhythm.  Pulmonary:     Effort: Pulmonary effort is normal. No respiratory distress.  Musculoskeletal:     Cervical back: Neck supple.  Skin:    General: Skin is dry.     Findings: Rash (moist, erythematous macular linear rash across umbilicus. No discharge. Non tender) present.  Neurological:     General: No focal deficit present.     Mental Status: She is alert. Mental status is at baseline.     Motor: No weakness.     Gait: Gait normal.  Psychiatric:        Mood and Affect: Mood normal.        Behavior: Behavior normal.        Thought Content: Thought content normal.  UC Treatments / Results  Labs (all labs ordered are listed, but only abnormal results  are displayed) Labs Reviewed - No data to display  EKG   Radiology No results found.  Procedures Procedures (including critical care time)  Medications Ordered in UC Medications - No data to display  Initial Impression / Assessment and Plan / UC Course  I have reviewed the triage vital signs and the nursing notes.  Pertinent labs & imaging results that were available during my care of the patient were reviewed by me and considered in my medical decision making (see chart for details).  81 year old female presenting for rash of umbilicus for the past couple of days.  It is itchy and malodorous.  On exam she does have moist, erythematous rash of skin fold of umbilicus that she leans forward.  Patient has kyphotic posturing.  I suspect the skin is rubbed together and caused intertrigo.  We will treat at this time with clotrimazole.  Also reviewed good hygiene.  Reviewed return and ER precautions especially if the redness/rash worsens or she notices discharge, has fever or pain.  Final Clinical Impressions(s) / UC Diagnoses   Final diagnoses:  Fungal rash of trunk     Discharge Instructions      -You have a fungal rash.  I have sent an antifungal cream to the pharmacy.  Make sure to keep the area clean and dry.  Use plain soap and water - Keep an eye on the area.  It should look better in the next few days to 1 week.  Use the cream until rash goes away. -If the rash worsens or you cannot pull the skin apart at your bellybutton, you have pain, you should be seen again.   ED Prescriptions     Medication Sig Dispense Auth. Provider   clotrimazole (LOTRIMIN) 1 % cream Apply to affected area 2 times daily 15 g Shirlee Latch, PA-C      PDMP not reviewed this encounter.   Eusebio Friendly B, PA-C 08/08/21 [phone]

## 2021-08-08 NOTE — Discharge Instructions (Signed)
-  You have a fungal rash.  I have sent an antifungal cream to the pharmacy.  Make sure to keep the area clean and dry.  Use plain soap and water - Keep an eye on the area.  It should look better in the next few days to 1 week.  Use the cream until rash goes away. -If the rash worsens or you cannot pull the skin apart at your bellybutton, you have pain, you should be seen again.

## 2021-08-11 ENCOUNTER — Other Ambulatory Visit: Payer: Self-pay

## 2021-08-11 ENCOUNTER — Encounter: Payer: Self-pay | Admitting: Emergency Medicine

## 2021-08-11 ENCOUNTER — Ambulatory Visit
Admission: EM | Admit: 2021-08-11 | Discharge: 2021-08-11 | Disposition: A | Discharge status: Home / Self Care | Payer: Medicare Other | Attending: Emergency Medicine | Admitting: Emergency Medicine

## 2021-08-11 MED ORDER — CEPHALEXIN 500 MG PO CAPS: 500.0000 mg | ORAL_CAPSULE | Freq: Two times a day (BID) | ORAL | 0 refills | Status: AC

## 2021-08-11 NOTE — Discharge Instructions (Addendum)
Continue use of antifungal cream as directed until Tuesday, August 15, 2021 ? ? begin use of Keflex, take twice a day for 5 days, this medication will cover for bacteria ? ?You may use plain soap and water to cleanse with your daily hygiene routine ? ?After completion of all medication if symptoms are still present you may follow-up with urgent care or your primary care doctor for reevaluation ?

## 2021-08-11 NOTE — ED Provider Notes (Signed)
?[facility] ? ? ? ?CSN: [id number] ?Arrival date & time: 08/11/21  0626 ? ? ?  ? ?History   ?Chief Complaint ?Chief Complaint  ?Patient presents with  ? Rash  ? ? ?HPI ?Maria Hood is a 81 y.o. female ? ? ?Patient presents with redness to her bellybutton for 6 days.  Denies pruritus, pain, drainage, fever, chills.  Endorses that she was prescribed medicine and there has not been an improvement with symptoms. ? ? ?Past Medical History:  ?Diagnosis Date  ? Arthritis   ? hands, lower back  ? Hypothyroidism   ? Thyroid disease   ? Wears dentures   ? full upper and lower  ? ? ?There are no problems to display for this patient. ? ? ?Past Surgical History:  ?Procedure Laterality Date  ? CATARACT EXTRACTION W/PHACO Left 02/02/2019  ? Procedure: CATARACT EXTRACTION PHACO AND INTRAOCULAR LENS PLACEMENT (IOC) LEFT;  Surgeon: Nevada Crane, MD;  Location: [facility];  Service: Ophthalmology;  Laterality: Left;  ? CATARACT EXTRACTION W/PHACO Right 03/02/2019  ? Procedure: CATARACT EXTRACTION PHACO AND INTRAOCULAR LENS PLACEMENT (IOC) RIGHT 01:31.3          19.8%          18.83;  Surgeon: Nevada Crane, MD;  Location: [facility];  Service: Ophthalmology;  Laterality: Right;  ? THYROID SURGERY    ? ? ?OB History   ?No obstetric history on file. ?  ? ? ? ?Home Medications   ? ?Prior to Admission medications   ?Medication Sig Start Date End Date Taking? Authorizing Provider  ?cephALEXin (KEFLEX) 500 MG capsule Take 1 capsule (500 mg total) by mouth 2 (two) times daily for 5 days. 08/11/21 08/16/21 Yes Caliegh Middlekauff, Elita Boone, NP  ?alendronate (FOSAMAX) 70 MG tablet Take 70 mg by mouth once a week. Take with a full glass of water on an empty stomach.    [provider]  ?aspirin 325 MG tablet Take 325 mg by mouth daily.    [provider]  ?calcium carbonate (OS-CAL) 1250 (500 CA) MG chewable tablet Chew 1 tablet by mouth daily.    [provider]  ?clotrimazole (LOTRIMIN) 1 %  cream Apply to affected area 2 times daily 08/08/21 08/18/21  Eusebio Friendly B, PA-C  ?diclofenac sodium (VOLTAREN) 1 % GEL Apply topically 4 (four) times daily as needed.    [provider]  ?hydroxychloroquine (PLAQUENIL) 200 MG tablet Take by mouth daily.    [provider]  ?leflunomide (ARAVA) 20 MG tablet Take 20 mg by mouth daily.    [provider]  ?levothyroxine (SYNTHROID, LEVOTHROID) 100 MCG tablet Take 100 mcg by mouth daily before breakfast.    [provider]  ?lisinopril (ZESTRIL) 2.5 MG tablet Take 2.5 mg by mouth daily.    [provider]  ?methocarbamol (ROBAXIN) 500 MG tablet Take 1 tablet (500 mg total) by mouth 2 (two) times daily. 04/29/21   Coralyn Mark, NP  ?Multiple Vitamin (MULTIVITAMIN) tablet Take 1 tablet by mouth daily.    [provider]  ?predniSONE (STERAPRED UNI-PAK 21 TAB) 10 MG (21) TBPK tablet Take by mouth daily. Take 6 tabs by mouth daily  for 2 days, then 5 tabs for 2 days, then 4 tabs for 2 days, then 3 tabs for 2 days, 2 tabs for 2 days, then 1 tab by mouth daily for 2 days 04/29/21   Coralyn Mark, NP  ?traMADol (ULTRAM) 50 MG tablet  Take by mouth every 6 (six) hours as needed.    [provider]  ? ? ?Family History ?History reviewed. No pertinent family history. ? ?Social History ?Social History  ? ?Tobacco Use  ? Smoking status: Former  ?  Types: Cigarettes  ?  Quit date: 1961  ?  Years since quitting: 62.2  ? Smokeless tobacco: Never  ? Tobacco comments:  ?  smoked as teenager  ?Vaping Use  ? Vaping Use: Never used  ?Substance Use Topics  ? Alcohol use: No  ? Drug use: Never  ? ? ? ?Allergies   ?Lodine [etodolac], Methotrexate derivatives, Rezulin [troglitazone], Sulfa antibiotics, and Vioxx [rofecoxib] ? ? ?Review of Systems ?Review of Systems  ?Constitutional: Negative.   ?Respiratory: Negative.    ?Cardiovascular: Negative.   ?Skin:  Positive for rash. Negative for color change, pallor and  wound.  ?Neurological: Negative.   ? ? ?Physical Exam ?Triage Vital Signs ?ED Triage Vitals  ?Enc Vitals Group  ?   BP 08/11/21 0850 (!) 166/85  ?   Pulse Rate 08/11/21 0850 89  ?   Resp 08/11/21 0850 14  ?   Temp 08/11/21 0850 98.3 ?F (36.8 ?C)  ?   Temp Source 08/11/21 0850 Oral  ?   SpO2 08/11/21 0850 97 %  ?   Weight 08/11/21 0848 107 lb (48.5 kg)  ?   Height 08/11/21 0848 5\' 5"  (1.651 m)  ?   Head Circumference --   ?   Peak Flow --   ?   Pain Score 08/11/21 0848 0  ?   Pain Loc --   ?   Pain Edu? --   ?   Excl. in GC? --   ? ?No data found. ? ?Updated Vital Signs ?BP (!) 166/85 (BP Location: Left Arm)   Pulse 89   Temp 98.3 ?F (36.8 ?C) (Oral)   Resp 14   Ht 5\' 5"  (1.651 m)   Wt 107 lb (48.5 kg)   SpO2 97%   BMI 17.81 kg/m?  ? ?Visual Acuity ?Right Eye Distance:   ?Left Eye Distance:   ?Bilateral Distance:   ? ?Right Eye Near:   ?Left Eye Near:    ?Bilateral Near:    ? ?Physical Exam ?Constitutional:   ?   Appearance: Normal appearance.  ?HENT:  ?   Head: Normocephalic.  ?Eyes:  ?   Extraocular Movements: Extraocular movements intact.  ?Pulmonary:  ?   Effort: Pulmonary effort is normal.  ?Abdominal:  ?   Comments: Erythema noted to the umbilicus, no drainage, tenderness, swelling noted  ?Skin: ?   General: Skin is warm and dry.  ?Neurological:  ?   Mental Status: She is alert and oriented to person, place, and time. Mental status is at baseline.  ?Psychiatric:     ?   Mood and Affect: Mood normal.     ?   Behavior: Behavior normal.  ? ? ? ?UC Treatments / Results  ?Labs ?(all labs ordered are listed, but only abnormal results are displayed) ?Labs Reviewed - No data to display ? ?EKG ? ? ?Radiology ?No results found. ? ?Procedures ?Procedures (including critical care time) ? ?Medications Ordered in UC ?Medications - No data to display ? ?Initial Impression / Assessment and Plan / UC Course  ?I have reviewed the triage vital signs and the nursing notes. ? ?Pertinent labs & imaging results that were  available during my care of the patient were reviewed by me and considered  in my medical decision making (see chart for details). ? ?Omphalitis ? ?Patient is confused about the timeline in which she was seen and prescribed medicine, has been using antifungal cream for 3 days where she thought she was using medication for at least a week, encourage patient to finish 7-day course of antifungal cream, will prescribe Keflex for coverage for bacteria in addition, daily cleansing with normal hygiene, given precautions to follow-up for persisting symptoms after completion of all medication ?Final Clinical Impressions(s) / UC Diagnoses  ? ?Final diagnoses:  ?Omphalitis  ? ? ? ?Discharge Instructions   ? ?  ?Continue use of antifungal cream as directed until Tuesday, August 15, 2021 ? ? begin use of Keflex, take twice a day for 5 days, this medication will cover for bacteria ? ?You may use plain soap and water to cleanse with your daily hygiene routine ? ?After completion of all medication if symptoms are still present you may follow-up with urgent care or your primary care doctor for reevaluation ? ? ?ED Prescriptions   ? ? Medication Sig Dispense Auth. Provider  ? cephALEXin (KEFLEX) 500 MG capsule Take 1 capsule (500 mg total) by mouth 2 (two) times daily for 5 days. 10 capsule Valinda Hoar, NP  ? ?  ? ?PDMP not reviewed this encounter. ?  ?Valinda Hoar, NP ?08/11/21 1427 ? ?

## 2021-08-11 NOTE — ED Triage Notes (Signed)
Patient states the redness has not gotten better at her belly button area after using the medicines that were prescribed on 2/28.  Patient denies fevers or chills.  ?

## 2022-05-28 ENCOUNTER — Ambulatory Visit
Admission: EM | Admit: 2022-05-28 | Discharge: 2022-05-28 | Disposition: A | Discharge status: Home / Self Care | Payer: Medicare Other | Attending: Internal Medicine | Admitting: Internal Medicine

## 2022-05-28 ENCOUNTER — Emergency Department
Admission: EM | Admit: 2022-05-28 | Discharge: 2022-05-29 | Disposition: A | Discharge status: Home / Self Care | Payer: Medicare Other | Attending: Emergency Medicine | Admitting: Emergency Medicine

## 2022-05-28 ENCOUNTER — Ambulatory Visit (INDEPENDENT_AMBULATORY_CARE_PROVIDER_SITE_OTHER): Payer: Medicare Other

## 2022-05-28 ENCOUNTER — Emergency Department: Payer: Medicare Other

## 2022-05-28 DIAGNOSIS — W109XXA Fall (on) (from) unspecified stairs and steps, initial encounter: Secondary | ICD-10-CM | POA: Diagnosis not present

## 2022-05-28 DIAGNOSIS — S42295A Other nondisplaced fracture of upper end of left humerus, initial encounter for closed fracture: Secondary | ICD-10-CM | POA: Insufficient documentation

## 2022-05-28 DIAGNOSIS — S42351A Displaced comminuted fracture of shaft of humerus, right arm, initial encounter for closed fracture: Secondary | ICD-10-CM | POA: Diagnosis not present

## 2022-05-28 DIAGNOSIS — M25512 Pain in left shoulder: Secondary | ICD-10-CM

## 2022-05-28 DIAGNOSIS — W19XXXA Unspecified fall, initial encounter: Secondary | ICD-10-CM | POA: Diagnosis not present

## 2022-05-28 DIAGNOSIS — S42292A Other displaced fracture of upper end of left humerus, initial encounter for closed fracture: Secondary | ICD-10-CM

## 2022-05-28 LAB — CBC WITH DIFFERENTIAL/PLATELET
Abs Immature Granulocytes: 0.03 10*3/uL (ref 0.00–0.07)
Basophils Absolute: 0 10*3/uL (ref 0.0–0.1)
Basophils Relative: 0 %
Eosinophils Absolute: 0 10*3/uL (ref 0.0–0.5)
Eosinophils Relative: 0 %
HCT: 32.1 % — ABNORMAL LOW (ref 36.0–46.0)
Hemoglobin: 10.3 g/dL — ABNORMAL LOW (ref 12.0–15.0)
Immature Granulocytes: 0 %
Lymphocytes Relative: 11 %
Lymphs Abs: 0.9 10*3/uL (ref 0.7–4.0)
MCH: 29.5 pg (ref 26.0–34.0)
MCHC: 32.1 g/dL (ref 30.0–36.0)
MCV: 92 fL (ref 80.0–100.0)
Monocytes Absolute: 0.7 10*3/uL (ref 0.1–1.0)
Monocytes Relative: 8 %
Neutro Abs: 6.4 10*3/uL (ref 1.7–7.7)
Neutrophils Relative %: 81 %
Platelets: 273 10*3/uL (ref 150–400)
RBC: 3.49 MIL/uL — ABNORMAL LOW (ref 3.87–5.11)
RDW: 12.8 % (ref 11.5–15.5)
WBC: 8.1 10*3/uL (ref 4.0–10.5)
nRBC: 0 % (ref 0.0–0.2)

## 2022-05-28 LAB — COMPREHENSIVE METABOLIC PANEL
ALT: 18 U/L (ref 0–44)
AST: 25 U/L (ref 15–41)
Albumin: 4 g/dL (ref 3.5–5.0)
Alkaline Phosphatase: 48 U/L (ref 38–126)
Anion gap: 9 (ref 5–15)
BUN: 28 mg/dL — ABNORMAL HIGH (ref 8–23)
CO2: 23 mmol/L (ref 22–32)
Calcium: 8.9 mg/dL (ref 8.9–10.3)
Chloride: 107 mmol/L (ref 98–111)
Creatinine, Ser: 1.19 mg/dL — ABNORMAL HIGH (ref 0.44–1.00)
GFR, Estimated: 45 mL/min — ABNORMAL LOW (ref >60)
Glucose, Bld: 133 mg/dL — ABNORMAL HIGH (ref 70–99)
Potassium: 4.8 mmol/L (ref 3.5–5.1)
Sodium: 139 mmol/L (ref 135–145)
Total Bilirubin: 0.9 mg/dL (ref 0.3–1.2)
Total Protein: 6.9 g/dL (ref 6.5–8.1)

## 2022-05-28 LAB — CK: Total CK: 175 U/L (ref 38–234)

## 2022-05-28 LAB — TROPONIN I (HIGH SENSITIVITY): Troponin I (High Sensitivity): 13 ng/L (ref <18)

## 2022-05-28 MED ORDER — ACETAMINOPHEN 500 MG PO TABS
500.0000 mg | ORAL_TABLET | Freq: Once | ORAL | Status: AC
  Administered 2022-05-28: 500 mg via ORAL

## 2022-05-28 NOTE — Discharge Instructions (Addendum)
Go to the ER right now, so the on call orthopedic doctor can take care of you

## 2022-05-28 NOTE — ED Provider Notes (Signed)
[facility]    CSN: [id number] Arrival date & time: 05/28/22  1433      History   Chief Complaint Chief Complaint  Patient presents with   Arm Injury    HPI Maria Hood is a 81 y.o. female presents with her daughter due to having L shoulder pain after falling onto it on the [place] today. She did not hit her head. Her daughter thinks she got off balance, when she turned around after opening her front door to get her groceries from the floor and noticed she did not have her L shoe on.     Past Medical History:  Diagnosis Date   Arthritis    hands, lower back   Hypothyroidism    Thyroid disease    Wears dentures    full upper and lower    There are no problems to display for this patient.   Past Surgical History:  Procedure Laterality Date   CATARACT EXTRACTION W/PHACO Left 02/02/2019   Procedure: CATARACT EXTRACTION PHACO AND INTRAOCULAR LENS PLACEMENT (IOC) LEFT;  Surgeon: Nevada Crane, MD;  Location: [facility];  Service: Ophthalmology;  Laterality: Left;   CATARACT EXTRACTION W/PHACO Right 03/02/2019   Procedure: CATARACT EXTRACTION PHACO AND INTRAOCULAR LENS PLACEMENT (IOC) RIGHT 01:31.3          19.8%          18.83;  Surgeon: Nevada Crane, MD;  Location: [facility];  Service: Ophthalmology;  Laterality: Right;   THYROID SURGERY      OB History   No obstetric history on file.      Home Medications    Prior to Admission medications   Medication Sig Start Date End Date Taking? Authorizing Provider  calcium carbonate (OS-CAL) 1250 (500 CA) MG chewable tablet Chew 1 tablet by mouth daily.   Yes [provider]  diclofenac sodium (VOLTAREN) 1 % GEL Apply topically 4 (four) times daily as needed.   Yes [provider]  leflunomide (ARAVA) 20 MG tablet Take 20 mg by mouth daily.   Yes [provider]  levothyroxine (SYNTHROID, LEVOTHROID) 100 MCG tablet Take 100 mcg by mouth daily before  breakfast.   Yes [provider]  lisinopril (ZESTRIL) 2.5 MG tablet Take 2.5 mg by mouth daily.   Yes [provider]  Multiple Vitamin (MULTIVITAMIN) tablet Take 1 tablet by mouth daily.   Yes [provider]  traMADol (ULTRAM) 50 MG tablet Take by mouth every 6 (six) hours as needed.   Yes [provider]    Family History History reviewed. No pertinent family history.  Social History Social History   Tobacco Use   Smoking status: Former    Types: Cigarettes    Quit date: 1961    Years since quitting: 63.0   Smokeless tobacco: Never   Tobacco comments:    smoked as teenager  Vaping Use   Vaping Use: Never used  Substance Use Topics   Alcohol use: No   Drug use: Never     Allergies   Lodine [etodolac], Methotrexate derivatives, Rezulin [troglitazone], Sulfa antibiotics, and Vioxx [rofecoxib]   Review of Systems Review of Systems  Musculoskeletal:  Positive for arthralgias and joint swelling.  Skin:  Negative for color change, pallor and wound.  Neurological:  Negative for numbness.     Physical Exam Triage Vital Signs ED Triage Vitals  Enc Vitals Group     BP 05/28/22 1654 (!) 158/82  Pulse Rate 05/28/22 1654 92     Resp 05/28/22 1654 18     Temp 05/28/22 1654 97.6 F (36.4 C)     Temp Source 05/28/22 1654 Oral     SpO2 05/28/22 1654 95 %     Weight 05/28/22 1651 105 lb (47.6 kg)     Height 05/28/22 1651 5\' 5"  (1.651 m)     Head Circumference --      Peak Flow --      Pain Score 05/28/22 1651 10     Pain Loc --      Pain Edu? --      Excl. in GC? --    No data found.  Updated Vital Signs BP (!) 158/82 (BP Location: Right Arm)   Pulse 92   Temp 97.6 F (36.4 C) (Oral)   Resp 18   Ht 5\' 5"  (1.651 m)   Wt 105 lb (47.6 kg)   SpO2 95%   BMI 17.47 kg/m   Visual Acuity Right Eye Distance:   Left Eye Distance:   Bilateral Distance:    Right Eye Near:   Left Eye Near:    Bilateral Near:     Physical  Exam Vitals and nursing note reviewed.  HENT:     Right Ear: External ear normal.     Left Ear: External ear normal.  Eyes:     General: No scleral icterus.    Conjunctiva/sclera: Conjunctivae normal.  Pulmonary:     Effort: Pulmonary effort is normal.  Musculoskeletal:     Cervical back: Neck supple.     Comments: L SHOULDER- clavicle non tender and no deformity noted. L proximal upper arm with moderate swelling, and pt is unable to move it due to severe pain.  I am unable to find a brachial pulse. Her radial and ulnar pulses are intact.   Skin:    General: Skin is warm and dry.     Capillary Refill: Capillary refill takes less than 2 seconds.  Neurological:     Mental Status: She is alert and oriented to person, place, and time.     Gait: Gait normal.  Psychiatric:        Mood and Affect: Mood normal.        Behavior: Behavior normal.        Thought Content: Thought content normal.      UC Treatments / Results  Labs (all labs ordered are listed, but only abnormal results are displayed) Labs Reviewed - No data to display  EKG   Radiology DG Shoulder Left  Result Date: 05/28/2022 CLINICAL DATA:  Fall EXAM: LEFT SHOULDER - 2+ VIEW COMPARISON:  None Available. FINDINGS: There is a comminuted and impacted fracture of the LEFT proximal humerus. It involves the surgical neck with predominately impaction at this site. There is intra-articular extension with mild cortical offset along the articular surface and involvement of the greater tuberosity. Glenohumeral joint appears grossly preserved. IMPRESSION: Comminuted and impacted fracture of the LEFT proximal humerus with intra-articular extension. Electronically Signed   By: M.D.   On: 05/28/2022 17:17    Procedures Procedures (including critical care time)  Medications Ordered in UC Medications  acetaminophen (TYLENOL) tablet 500 mg (500 mg Oral Given 05/28/22 1800)    Initial Impression / Assessment and  Plan / UC Course  I have reviewed the triage vital signs and the nursing notes.  Pertinent  imaging results that were available during my care of the patient were  reviewed by me and considered in my medical decision making (see chart for details).  Comminuted closed fracture of L proximal humerus  She was placed on sling, and sent to ER.   Final Clinical Impressions(s) / UC Diagnoses   Final diagnoses:  Closed fracture of head of left humerus, initial encounter     Discharge Instructions      Go to the ER right now, so the on call orthopedic doctor can take care of you    ED Prescriptions   None    PDMP not reviewed this encounter.   Garey Ham, Cordelia Poche 05/28/22 2157

## 2022-05-28 NOTE — ED Triage Notes (Signed)
Ambulatory to triage with c/o Pt had fall from standing apx 930am today. Pt states she laid on ground for unknown length of time because she could not get up.  Pt states fall was mechanical, going up steps while carrying groceries. Denies LOC, denies use of blood thinner. Pt arrives in sling to left shoulder, placed by [facility].  +CMS to affected extremity

## 2022-05-28 NOTE — ED Triage Notes (Signed)
Pt c/o fall x1day  Pt states that she fell at 9am.   Pt denies any pain in her wrist or forearm. Pt states taht there is only pain in the left shoulder and theres only pain when her shoulder moves or is raised.  Pt states that her shoes came off and she fell backwards off of her steps and into the driveway.

## 2022-05-28 NOTE — ED Provider Notes (Signed)
[facility] Provider Note    Event Date/Time   First MD Initiated Contact with Patient 05/28/22 2218     (approximate)   History   Shoulder Injury   HPI  Maria Hood is a 81 y.o. female who presents after a fall today, she complains of left shoulder pain.  No other injuries reported     Physical Exam   Triage Vital Signs: ED Triage Vitals  Enc Vitals Group     BP 05/28/22 2108 (!) 174/81     Pulse Rate 05/28/22 2108 93     Resp 05/28/22 2108 17     Temp 05/28/22 2108 98.1 F (36.7 C)     Temp src --      SpO2 05/28/22 2108 95 %     Weight 05/28/22 2107 47.6 kg (105 lb)     Height 05/28/22 2107 1.651 m (5\' 5" )     Head Circumference --      Peak Flow --      Pain Score 05/28/22 2107 6     Pain Loc --      Pain Edu? --      Excl. in GC? --     Most recent vital signs: Vitals:   05/28/22 2108  BP: (!) 174/81  Pulse: 93  Resp: 17  Temp: 98.1 F (36.7 C)  SpO2: 95%     General: Awake, no distress.  CV:  Good peripheral perfusion.  Resp:  Normal effort.  Abd:  No distention.  Other:  Left arm in a sling, tenderness to the proximal humerus, warm and well-perfused, normal pulses distally   ED Results / Procedures / Treatments   Labs (all labs ordered are listed, but only abnormal results are displayed) Labs Reviewed  CBC WITH DIFFERENTIAL/PLATELET - Abnormal; Notable for the following components:      Result Value   RBC 3.49 (*)    Hemoglobin 10.3 (*)    HCT 32.1 (*)    All other components within normal limits  COMPREHENSIVE METABOLIC PANEL - Abnormal; Notable for the following components:   Glucose, Bld 133 (*)    BUN 28 (*)    Creatinine, Ser 1.19 (*)    GFR, Estimated 45 (*)    All other components within normal limits  CK  TROPONIN I (HIGH SENSITIVITY)  TROPONIN I (HIGH SENSITIVITY)     EKG     RADIOLOGY X-ray viewed interpreted by me, left proximal humerus fracture,  comminuted    PROCEDURES:  Critical Care performed:   Procedures   MEDICATIONS ORDERED IN ED: Medications - No data to display   IMPRESSION / MDM / ASSESSMENT AND PLAN / ED COURSE  I reviewed the triage vital signs and the nursing notes. Patient's presentation is most consistent with acute complicated illness / injury requiring diagnostic workup.  Patient presents after a fall, isolated injury to the left shoulder, she does have a comminuted left proximal humerus fracture.  She will take her tramadol for pain control, she is already in a sling.  Will follow-up with orthopedics as an outpatient, no indication for Ortho consultation in the emergency department at this time.        FINAL CLINICAL IMPRESSION(S) / ED DIAGNOSES   Final diagnoses:  Other closed nondisplaced fracture of proximal end of left humerus, initial encounter     Rx / DC Orders   ED Discharge Orders     None        Note:  This document was prepared using Dragon voice recognition software and may include unintentional dictation errors.   Jene Every, MD 05/28/22 [phone]

## 2022-05-30 ENCOUNTER — Other Ambulatory Visit: Payer: Self-pay | Admitting: Physician Assistant

## 2022-05-30 ENCOUNTER — Ambulatory Visit
Admission: RE | Admit: 2022-05-30 | Discharge: 2022-05-30 | Disposition: A | Discharge status: Home / Self Care | Payer: Medicare Other | Source: Ambulatory Visit | Attending: Physician Assistant | Admitting: Physician Assistant

## 2022-05-30 DIAGNOSIS — S42352A Displaced comminuted fracture of shaft of humerus, left arm, initial encounter for closed fracture: Secondary | ICD-10-CM | POA: Insufficient documentation

## 2023-12-27 ENCOUNTER — Encounter: Payer: Self-pay | Admitting: Advanced Practice Midwife
